# Patient Record
Sex: Female | Born: 1950 | Race: Black or African American | Hispanic: No | Marital: Married | Smoking: Never smoker
Health system: Southern US, Community
[De-identification: ages and names within clinical notes are randomized; demographics above are authoritative.]

## PROBLEM LIST (undated history)

## (undated) DIAGNOSIS — I10 Essential (primary) hypertension: Secondary | ICD-10-CM

## (undated) DIAGNOSIS — C801 Malignant (primary) neoplasm, unspecified: Secondary | ICD-10-CM

## (undated) HISTORY — PX: BREAST SURGERY: SHX581

## (undated) HISTORY — PX: OTHER SURGICAL HISTORY: SHX169

## (undated) HISTORY — PX: TUBAL LIGATION: SHX77

---

## 2005-01-11 ENCOUNTER — Ambulatory Visit (HOSPITAL_COMMUNITY): Admission: RE | Admit: 2005-01-11 | Discharge: 2005-01-11 | Payer: Self-pay | Admitting: Family Medicine

## 2005-01-19 ENCOUNTER — Encounter (INDEPENDENT_AMBULATORY_CARE_PROVIDER_SITE_OTHER): Payer: Self-pay | Admitting: *Deleted

## 2005-01-19 ENCOUNTER — Encounter: Admission: RE | Admit: 2005-01-19 | Discharge: 2005-01-19 | Payer: Self-pay | Admitting: Family Medicine

## 2005-01-19 ENCOUNTER — Encounter (INDEPENDENT_AMBULATORY_CARE_PROVIDER_SITE_OTHER): Payer: Self-pay | Admitting: Diagnostic Radiology

## 2005-01-25 ENCOUNTER — Ambulatory Visit: Payer: Self-pay | Admitting: Oncology

## 2005-01-27 ENCOUNTER — Encounter (HOSPITAL_COMMUNITY): Admission: RE | Admit: 2005-01-27 | Discharge: 2005-04-27 | Payer: Self-pay | Admitting: General Surgery

## 2005-02-02 ENCOUNTER — Encounter: Admission: RE | Admit: 2005-02-02 | Discharge: 2005-02-02 | Payer: Self-pay | Admitting: Oncology

## 2005-02-03 ENCOUNTER — Ambulatory Visit (HOSPITAL_COMMUNITY): Admission: RE | Admit: 2005-02-03 | Discharge: 2005-02-03 | Payer: Self-pay | Admitting: General Surgery

## 2005-02-03 ENCOUNTER — Ambulatory Visit (HOSPITAL_BASED_OUTPATIENT_CLINIC_OR_DEPARTMENT_OTHER): Admission: RE | Admit: 2005-02-03 | Discharge: 2005-02-03 | Payer: Self-pay | Admitting: General Surgery

## 2005-02-09 ENCOUNTER — Ambulatory Visit: Payer: Self-pay

## 2005-03-16 ENCOUNTER — Ambulatory Visit: Payer: Self-pay | Admitting: Oncology

## 2005-05-03 ENCOUNTER — Ambulatory Visit: Payer: Self-pay | Admitting: Oncology

## 2005-05-10 ENCOUNTER — Encounter: Admission: RE | Admit: 2005-05-10 | Discharge: 2005-05-10 | Payer: Self-pay | Admitting: Oncology

## 2005-05-22 ENCOUNTER — Ambulatory Visit: Admission: RE | Admit: 2005-05-22 | Discharge: 2005-07-20 | Payer: Self-pay | Admitting: *Deleted

## 2005-06-20 ENCOUNTER — Ambulatory Visit: Payer: Self-pay | Admitting: Oncology

## 2005-07-24 ENCOUNTER — Ambulatory Visit (HOSPITAL_COMMUNITY): Admission: RE | Admit: 2005-07-24 | Discharge: 2005-07-24 | Payer: Self-pay | Admitting: Oncology

## 2005-08-09 ENCOUNTER — Ambulatory Visit (HOSPITAL_COMMUNITY): Admission: RE | Admit: 2005-08-09 | Discharge: 2005-08-09 | Payer: Self-pay | Admitting: Oncology

## 2005-08-14 ENCOUNTER — Ambulatory Visit: Payer: Self-pay | Admitting: Oncology

## 2005-08-18 ENCOUNTER — Ambulatory Visit: Admission: RE | Admit: 2005-08-18 | Discharge: 2005-09-11 | Payer: Self-pay | Admitting: *Deleted

## 2005-08-24 ENCOUNTER — Encounter (INDEPENDENT_AMBULATORY_CARE_PROVIDER_SITE_OTHER): Payer: Self-pay | Admitting: Specialist

## 2005-08-24 ENCOUNTER — Ambulatory Visit (HOSPITAL_COMMUNITY): Admission: RE | Admit: 2005-08-24 | Discharge: 2005-08-25 | Payer: Self-pay | Admitting: General Surgery

## 2005-09-11 ENCOUNTER — Inpatient Hospital Stay (HOSPITAL_COMMUNITY): Admission: RE | Admit: 2005-09-11 | Discharge: 2005-09-19 | Payer: Self-pay | Admitting: Surgery

## 2005-10-02 ENCOUNTER — Ambulatory Visit: Payer: Self-pay | Admitting: Oncology

## 2005-11-08 ENCOUNTER — Ambulatory Visit (HOSPITAL_COMMUNITY): Admission: RE | Admit: 2005-11-08 | Discharge: 2005-11-09 | Payer: Self-pay | Admitting: General Surgery

## 2005-11-23 IMAGING — CT CT HEAD WO/W CM
1 of 5 series · 12 of 30 positions shown, 15 images · IV contrast (GASTROGRAFIN & [ID] OMNI 300)
Comparison: none

CLINICAL DATA: History of breast carcinoma.  Staging for new diagnosis left breast carcinoma. 
 CT HEAD W/O AND W/CONTRAST: 
 Axial scans from the base to the vertex were performed before and after IV contrast media was given.  172cc of Omnipaque 300 were given as the contrast media.  The ventricular system is normal in size and configuration and the septum is in a normal midline position.  The 4th ventricle and basilar cisterns appear normal.  No acute intracranial abnormality is seen.  No mass effect is noted.  After contrast enhancement, no enhancing lesion is seen.  On bone window images no bony abnormality is noted.

[Series 3: chest/abd/pelvis · axial · 0.64mm/px · z∈[-640,-144]mm · 12 of 118 slices shown, 15 images]
[im 10/118  brain]
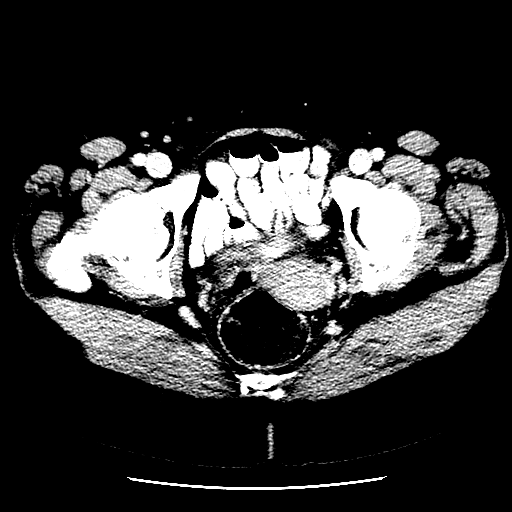
[im 10/118  bone]
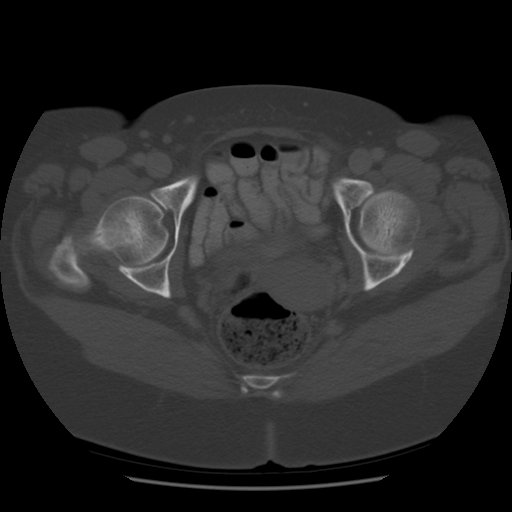
[im 19/118  brain]
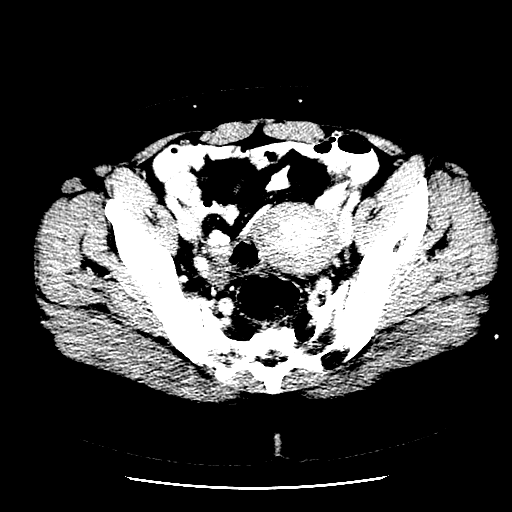
[im 28/118  brain]
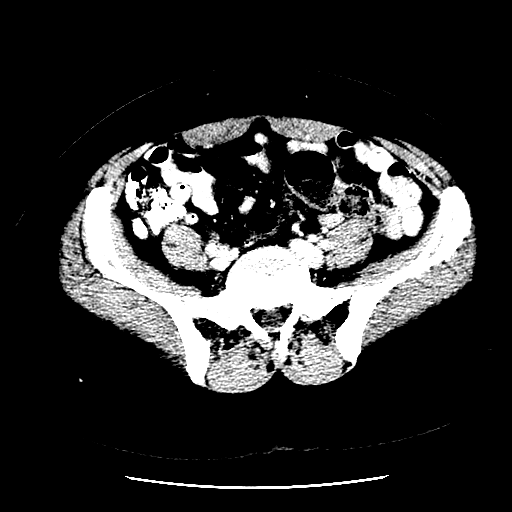
[im 37/118  brain]
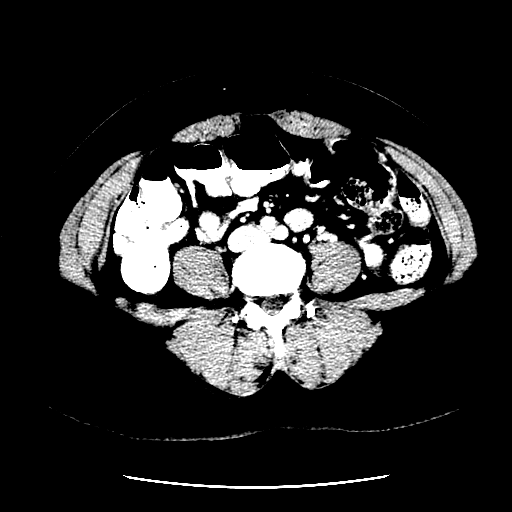
[im 46/118  brain]
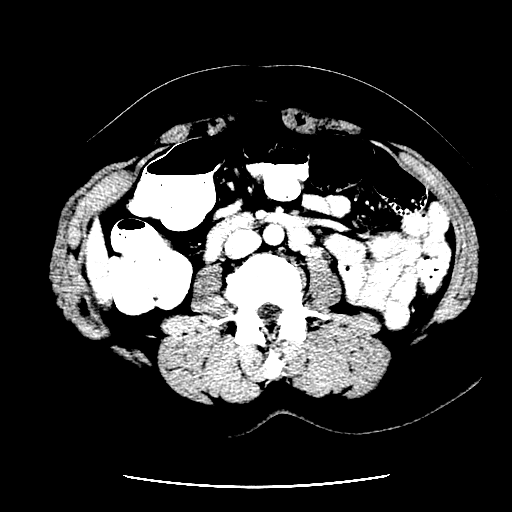
[im 46/118  bone]
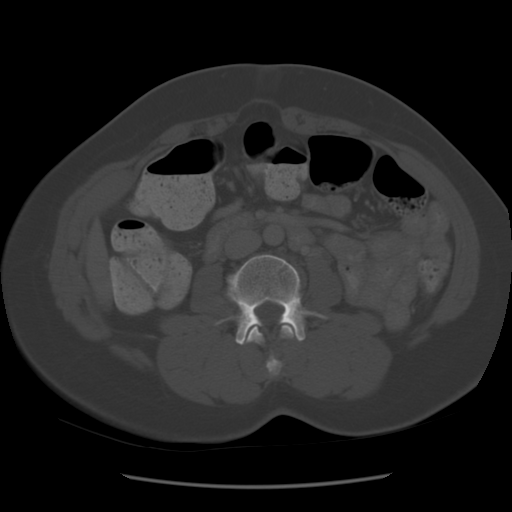
[im 55/118  brain]
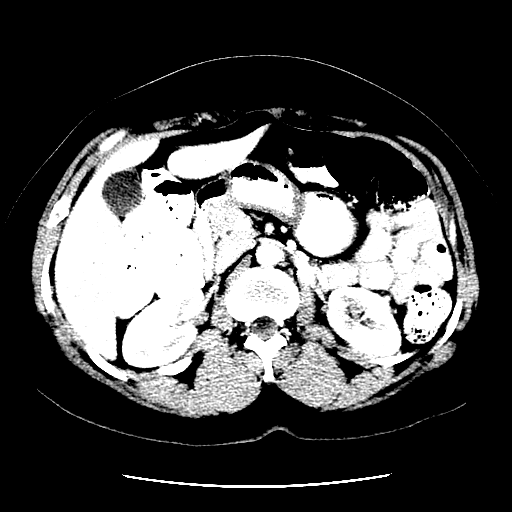
[im 64/118  brain]
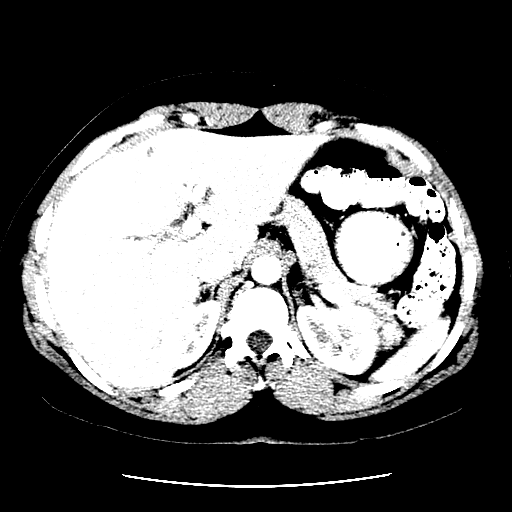
[im 73/118  brain]
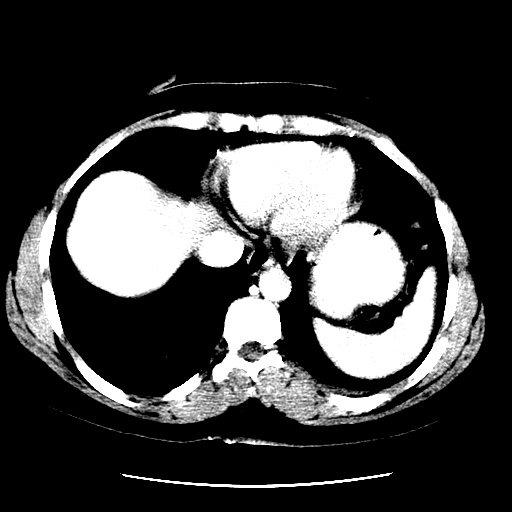
[im 82/118  brain]
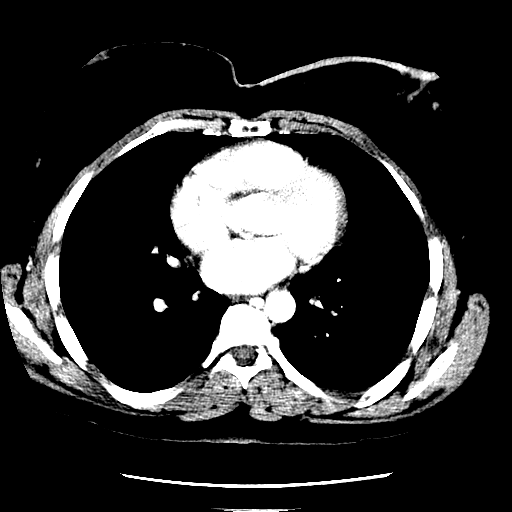
[im 82/118  bone]
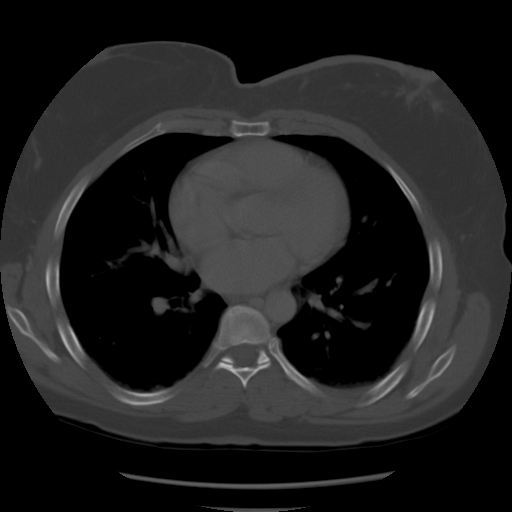
[im 91/118  brain]
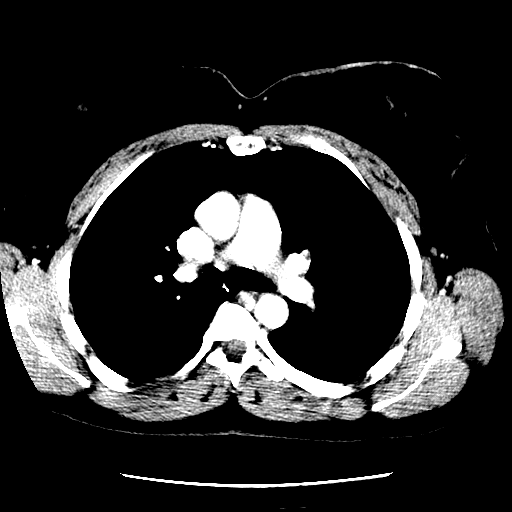
[im 100/118  brain]
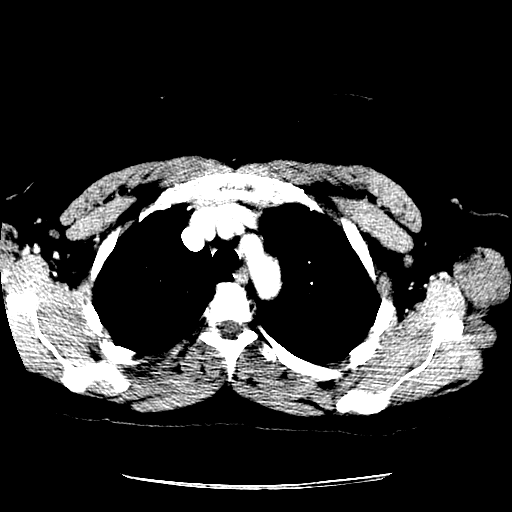
[im 109/118  brain]
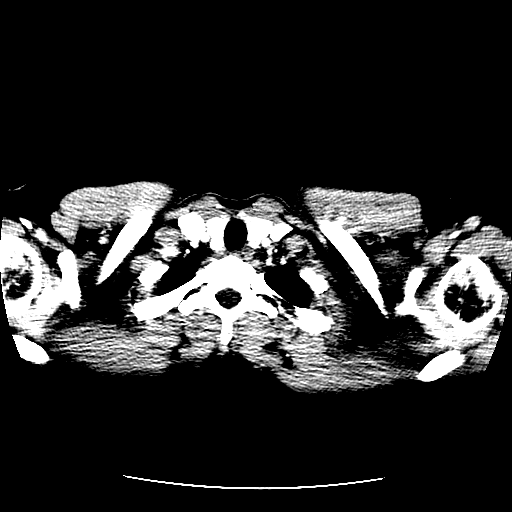

[12 of 30 positions shown; findings below may reference images not displayed]

IMPRESSION: Negative CT brain scan.  No metastatic involvement.  
 CT CHEST W/CONTRAST: 
 Multidetector helical scans through the chest were performed after IV contrast media was given.  No suspicious lung nodule is seen.  There is no evidence of mediastinal or hilar adenopathy.  The thoracic aorta and pulmonary arteries and grossly normal.  No axillary adenopathy is noted.  Asymmetrical soft tissue nodularity is noted in the left mid retroareolar breast in this patient with known left breast carcinoma.  There appears to be a very small left pleural effusion present.  The attenuation is lower than would be expected for pleural thickening with attenuation of 12 hounsfield units.
IMPRESSION: 1.  No lung metastasis.  No mediastinal or hilar adenopathy.  No pathologically enlarged axillary nodes are seen. 
 2.  Left breast nodular lesion with lesion in left retroareolar breast with retraction of the nipple.  
 3.  Question small left pleural effusion. 
 CT ABDOMEN W/CONTRAST: 
 Scans were continued through the abdomen after oral and IV contrast media were given.  There are multiple rounded low attenuation structures throughout the liver most consistent with benign process such as cyst.  These do not appear to represent metastatic lesion, but in view of the current clinical pattern ultrasound or MR Jaya be helpful to further evaluate these hepatic rounded low attenuation structures.  No calcified gallstones are seen.  The pancreas is normal in size as are the adrenal glands and the spleen.  The kidneys enhance normally and on delayed images pelvocaliceal systems appear normal.  The proximal ureters are normal in caliber.  The abdominal aorta appears normal.  No adenopathy is seen.  Herniation of fat is noted at the level of the umbilicus.
IMPRESSION: 1.  Multiple rounded low attenuation structures within the liver most consistent with benign process such as cyst.  However in view of the patient?s history further assessment with MR or ultrasound may be helpful. 
 2.  No evidence of adenopathy. 
 3.  Umbilical hernia containing fat. 
 CT PELVIS W/CONTRAST: 
 The uterus is normal in size.  The urinary bladder is decompressed and unremarkable.  No free fluid is seen within the pelvis.  No adenopathy is noted.  In reviewing bone window images the faint activity on bone scan in the lower lumbar spine on today?s bone scan is most likely due to degenerative change involving the facet joints of the lower lumbar spine.  No lytic or blastic lesion is evident.
IMPRESSION: 1.  Negative CT of the pelvis for metastatic involvement. 
 2.  Faint activity in the lower lumbar spine on today?s bone scan probably is due to degenerative change involving the facet joints.

## 2005-12-02 ENCOUNTER — Emergency Department (HOSPITAL_COMMUNITY): Admission: EM | Admit: 2005-12-02 | Discharge: 2005-12-02 | Payer: Self-pay | Admitting: Emergency Medicine

## 2005-12-06 ENCOUNTER — Ambulatory Visit: Payer: Self-pay | Admitting: Oncology

## 2005-12-07 ENCOUNTER — Ambulatory Visit (HOSPITAL_COMMUNITY): Admission: RE | Admit: 2005-12-07 | Discharge: 2005-12-07 | Payer: Self-pay | Admitting: Oncology

## 2006-01-26 ENCOUNTER — Ambulatory Visit: Payer: Self-pay | Admitting: Oncology

## 2006-01-31 ENCOUNTER — Ambulatory Visit (HOSPITAL_COMMUNITY): Admission: RE | Admit: 2006-01-31 | Discharge: 2006-01-31 | Payer: Self-pay | Admitting: Oncology

## 2006-02-22 ENCOUNTER — Encounter: Admission: RE | Admit: 2006-02-22 | Discharge: 2006-02-22 | Payer: Self-pay | Admitting: Oncology

## 2006-03-15 ENCOUNTER — Ambulatory Visit: Payer: Self-pay | Admitting: Oncology

## 2006-03-19 ENCOUNTER — Encounter: Admission: RE | Admit: 2006-03-19 | Discharge: 2006-03-19 | Payer: Self-pay | Admitting: Oncology

## 2006-04-25 ENCOUNTER — Encounter: Admission: RE | Admit: 2006-04-25 | Discharge: 2006-04-25 | Payer: Self-pay | Admitting: Family Medicine

## 2006-04-25 ENCOUNTER — Encounter (INDEPENDENT_AMBULATORY_CARE_PROVIDER_SITE_OTHER): Payer: Self-pay | Admitting: Specialist

## 2006-04-25 ENCOUNTER — Ambulatory Visit (HOSPITAL_COMMUNITY): Admission: RE | Admit: 2006-04-25 | Discharge: 2006-04-25 | Payer: Self-pay | Admitting: Oncology

## 2006-04-25 ENCOUNTER — Encounter (INDEPENDENT_AMBULATORY_CARE_PROVIDER_SITE_OTHER): Payer: Self-pay | Admitting: Diagnostic Radiology

## 2006-04-30 ENCOUNTER — Ambulatory Visit: Payer: Self-pay | Admitting: Oncology

## 2006-04-30 LAB — CBC WITH DIFFERENTIAL/PLATELET
Eosinophils Absolute: 0.3 10*3/uL (ref 0.0–0.5)
HCT: 35.8 % (ref 34.8–46.6)
LYMPH%: 36.7 % (ref 14.0–48.0)
MCHC: 34.4 g/dL (ref 32.0–36.0)
MONO#: 0.4 10*3/uL (ref 0.1–0.9)
NEUT%: 45.2 % (ref 39.6–76.8)
Platelets: 213 10*3/uL (ref 145–400)
WBC: 3.5 10*3/uL — ABNORMAL LOW (ref 3.9–10.0)

## 2006-05-21 ENCOUNTER — Ambulatory Visit (HOSPITAL_COMMUNITY): Admission: RE | Admit: 2006-05-21 | Discharge: 2006-05-22 | Payer: Self-pay | Admitting: General Surgery

## 2006-05-21 ENCOUNTER — Encounter (INDEPENDENT_AMBULATORY_CARE_PROVIDER_SITE_OTHER): Payer: Self-pay | Admitting: Specialist

## 2006-05-22 ENCOUNTER — Emergency Department (HOSPITAL_COMMUNITY): Admission: EM | Admit: 2006-05-22 | Discharge: 2006-05-22 | Payer: Self-pay | Admitting: Emergency Medicine

## 2006-06-13 ENCOUNTER — Ambulatory Visit: Payer: Self-pay | Admitting: Oncology

## 2006-06-13 LAB — CBC WITH DIFFERENTIAL/PLATELET
BASO%: 0.4 % (ref 0.0–2.0)
Eosinophils Absolute: 0.2 10*3/uL (ref 0.0–0.5)
LYMPH%: 39.1 % (ref 14.0–48.0)
MCHC: 33.8 g/dL (ref 32.0–36.0)
MONO#: 0.4 10*3/uL (ref 0.1–0.9)
NEUT#: 1.6 10*3/uL (ref 1.5–6.5)
Platelets: 247 10*3/uL (ref 145–400)
RBC: 4.01 10*6/uL (ref 3.70–5.32)
RDW: 14.4 % (ref 11.3–14.5)
WBC: 3.6 10*3/uL — ABNORMAL LOW (ref 3.9–10.0)

## 2006-06-13 LAB — COMPREHENSIVE METABOLIC PANEL
ALT: <8 U/L (ref 0–40)
AST: 11 U/L (ref 0–37)
CO2: 31 mEq/L (ref 19–32)
Calcium: 8.9 mg/dL (ref 8.4–10.5)
Chloride: 103 mEq/L (ref 96–112)
Creatinine, Ser: 0.85 mg/dL (ref 0.40–1.20)
Sodium: 142 mEq/L (ref 135–145)
Total Bilirubin: 0.4 mg/dL (ref 0.3–1.2)
Total Protein: 6.9 g/dL (ref 6.0–8.3)

## 2006-06-13 LAB — CANCER ANTIGEN 27.29: CA 27.29: 15 U/mL (ref 0–39)

## 2006-06-14 ENCOUNTER — Ambulatory Visit: Admission: RE | Admit: 2006-06-14 | Discharge: 2006-09-12 | Payer: Self-pay | Admitting: *Deleted

## 2006-06-26 ENCOUNTER — Ambulatory Visit: Payer: Self-pay

## 2006-06-26 ENCOUNTER — Encounter: Payer: Self-pay | Admitting: Internal Medicine

## 2006-06-27 ENCOUNTER — Ambulatory Visit (HOSPITAL_COMMUNITY): Admission: RE | Admit: 2006-06-27 | Discharge: 2006-06-27 | Payer: Self-pay | Admitting: Oncology

## 2006-07-27 LAB — CBC WITH DIFFERENTIAL/PLATELET
Basophils Absolute: 0 10*3/uL (ref 0.0–0.1)
Eosinophils Absolute: 0.2 10*3/uL (ref 0.0–0.5)
HCT: 35.7 % (ref 34.8–46.6)
HGB: 11.6 g/dL (ref 11.6–15.9)
LYMPH%: 29.7 % (ref 14.0–48.0)
MONO#: 0.3 10*3/uL (ref 0.1–0.9)
NEUT#: 0.9 10*3/uL — ABNORMAL LOW (ref 1.5–6.5)
Platelets: 165 10*3/uL (ref 145–400)
RBC: 4.04 10*6/uL (ref 3.70–5.32)
WBC: 1.9 10*3/uL — ABNORMAL LOW (ref 3.9–10.0)

## 2006-07-27 LAB — COMPREHENSIVE METABOLIC PANEL
Albumin: 3.5 g/dL (ref 3.5–5.2)
Alkaline Phosphatase: 52 U/L (ref 39–117)
BUN: 14 mg/dL (ref 6–23)
Creatinine, Ser: 0.67 mg/dL (ref 0.40–1.20)
Glucose, Bld: 84 mg/dL (ref 70–99)
Total Bilirubin: 0.3 mg/dL (ref 0.3–1.2)

## 2006-08-10 ENCOUNTER — Ambulatory Visit (HOSPITAL_COMMUNITY): Admission: RE | Admit: 2006-08-10 | Discharge: 2006-08-10 | Payer: Self-pay | Admitting: Oncology

## 2006-08-15 ENCOUNTER — Ambulatory Visit: Payer: Self-pay | Admitting: Oncology

## 2006-08-17 LAB — CBC WITH DIFFERENTIAL/PLATELET
Basophils Absolute: 0 10*3/uL (ref 0.0–0.1)
EOS%: 10.6 % — ABNORMAL HIGH (ref 0.0–7.0)
Eosinophils Absolute: 0.2 10*3/uL (ref 0.0–0.5)
HCT: 35.8 % (ref 34.8–46.6)
HGB: 12.3 g/dL (ref 11.6–15.9)
MCH: 29.5 pg (ref 26.0–34.0)
NEUT#: 1.2 10*3/uL — ABNORMAL LOW (ref 1.5–6.5)
NEUT%: 56.2 % (ref 39.6–76.8)
lymph#: 0.3 10*3/uL — ABNORMAL LOW (ref 0.9–3.3)

## 2006-09-07 LAB — CBC WITH DIFFERENTIAL/PLATELET
BASO%: 1.8 % (ref 0.0–2.0)
EOS%: 3.1 % (ref 0.0–7.0)
LYMPH%: 18.5 % (ref 14.0–48.0)
MCHC: 34.2 g/dL (ref 32.0–36.0)
MCV: 85.3 fL (ref 81.0–101.0)
MONO#: 0.5 10*3/uL (ref 0.1–0.9)
MONO%: 14.8 % — ABNORMAL HIGH (ref 0.0–13.0)
Platelets: 167 10*3/uL (ref 145–400)
RBC: 4.2 10*6/uL (ref 3.70–5.32)
WBC: 3.1 10*3/uL — ABNORMAL LOW (ref 3.9–10.0)

## 2006-09-14 ENCOUNTER — Ambulatory Visit (HOSPITAL_COMMUNITY): Admission: RE | Admit: 2006-09-14 | Discharge: 2006-09-14 | Payer: Self-pay | Admitting: Oncology

## 2006-09-17 LAB — CBC WITH DIFFERENTIAL/PLATELET
BASO%: 0.6 % (ref 0.0–2.0)
MCHC: 33.8 g/dL (ref 32.0–36.0)
MONO#: 0.4 10*3/uL (ref 0.1–0.9)
RBC: 4.15 10*6/uL (ref 3.70–5.32)
WBC: 3 10*3/uL — ABNORMAL LOW (ref 3.9–10.0)
lymph#: 0.8 10*3/uL — ABNORMAL LOW (ref 0.9–3.3)

## 2006-09-28 LAB — CBC WITH DIFFERENTIAL/PLATELET
BASO%: 1.7 % (ref 0.0–2.0)
Basophils Absolute: 0 10*3/uL (ref 0.0–0.1)
HCT: 35.2 % (ref 34.8–46.6)
HGB: 11.9 g/dL (ref 11.6–15.9)
MCHC: 33.7 g/dL (ref 32.0–36.0)
MONO#: 0.5 10*3/uL (ref 0.1–0.9)
NEUT%: 42.6 % (ref 39.6–76.8)
RDW: 13.9 % (ref 11.3–14.5)
WBC: 2.2 10*3/uL — ABNORMAL LOW (ref 3.9–10.0)
lymph#: 0.6 10*3/uL — ABNORMAL LOW (ref 0.9–3.3)

## 2006-10-17 ENCOUNTER — Ambulatory Visit: Payer: Self-pay | Admitting: Oncology

## 2006-10-19 LAB — COMPREHENSIVE METABOLIC PANEL
ALT: 15 U/L (ref 0–35)
Albumin: 3.7 g/dL (ref 3.5–5.2)
BUN: 9 mg/dL (ref 6–23)
CO2: 25 mEq/L (ref 19–32)
Calcium: 8.5 mg/dL (ref 8.4–10.5)
Chloride: 105 mEq/L (ref 96–112)
Creatinine, Ser: 0.63 mg/dL (ref 0.40–1.20)

## 2006-10-19 LAB — CBC WITH DIFFERENTIAL/PLATELET
Basophils Absolute: 0.1 10*3/uL (ref 0.0–0.1)
HCT: 32.9 % — ABNORMAL LOW (ref 34.8–46.6)
HGB: 10.8 g/dL — ABNORMAL LOW (ref 11.6–15.9)
MONO#: 0.5 10*3/uL (ref 0.1–0.9)
NEUT#: 1.5 10*3/uL (ref 1.5–6.5)
NEUT%: 53.4 % (ref 39.6–76.8)
WBC: 2.8 10*3/uL — ABNORMAL LOW (ref 3.9–10.0)
lymph#: 0.7 10*3/uL — ABNORMAL LOW (ref 0.9–3.3)

## 2006-11-09 LAB — CBC WITH DIFFERENTIAL/PLATELET
BASO%: 0.6 % (ref 0.0–2.0)
EOS%: 5.8 % (ref 0.0–7.0)
LYMPH%: 33 % (ref 14.0–48.0)
MCH: 28.7 pg (ref 26.0–34.0)
MCHC: 33.4 g/dL (ref 32.0–36.0)
MONO#: 0.3 10*3/uL (ref 0.1–0.9)
MONO%: 10.9 % (ref 0.0–13.0)
NEUT%: 49.6 % (ref 39.6–76.8)
Platelets: 199 10*3/uL (ref 145–400)
RBC: 4.2 10*6/uL (ref 3.70–5.32)
WBC: 3 10*3/uL — ABNORMAL LOW (ref 3.9–10.0)

## 2006-11-22 ENCOUNTER — Ambulatory Visit: Admission: RE | Admit: 2006-11-22 | Discharge: 2006-11-22 | Payer: Self-pay | Admitting: Oncology

## 2006-11-22 ENCOUNTER — Encounter (INDEPENDENT_AMBULATORY_CARE_PROVIDER_SITE_OTHER): Payer: Self-pay | Admitting: *Deleted

## 2006-11-22 ENCOUNTER — Ambulatory Visit (HOSPITAL_COMMUNITY): Admission: RE | Admit: 2006-11-22 | Discharge: 2006-11-22 | Payer: Self-pay | Admitting: Oncology

## 2006-11-27 ENCOUNTER — Ambulatory Visit: Payer: Self-pay | Admitting: Oncology

## 2006-11-29 LAB — CBC WITH DIFFERENTIAL/PLATELET
BASO%: 0.7 % (ref 0.0–2.0)
EOS%: 4.4 % (ref 0.0–7.0)
HCT: 36.6 % (ref 34.8–46.6)
MCH: 28.8 pg (ref 26.0–34.0)
MCHC: 33 g/dL (ref 32.0–36.0)
MONO#: 0.4 10*3/uL (ref 0.1–0.9)
RBC: 4.2 10*6/uL (ref 3.70–5.32)
RDW: 14 % (ref 11.3–14.5)
WBC: 3.6 10*3/uL — ABNORMAL LOW (ref 3.9–10.0)
lymph#: 1.1 10*3/uL (ref 0.9–3.3)

## 2006-11-29 LAB — COMPREHENSIVE METABOLIC PANEL
ALT: 11 U/L (ref 0–35)
AST: 16 U/L (ref 0–37)
CO2: 24 mEq/L (ref 19–32)
Calcium: 9.2 mg/dL (ref 8.4–10.5)
Chloride: 103 mEq/L (ref 96–112)
Creatinine, Ser: 0.75 mg/dL (ref 0.40–1.20)
Potassium: 3.9 mEq/L (ref 3.5–5.3)
Sodium: 140 mEq/L (ref 135–145)
Total Protein: 7.1 g/dL (ref 6.0–8.3)

## 2006-11-29 LAB — CANCER ANTIGEN 27.29: CA 27.29: 13 U/mL (ref 0–39)

## 2007-01-09 ENCOUNTER — Ambulatory Visit: Payer: Self-pay | Admitting: Oncology

## 2007-01-11 LAB — CBC WITH DIFFERENTIAL/PLATELET
Basophils Absolute: 0 10*3/uL (ref 0.0–0.1)
Eosinophils Absolute: 0.1 10*3/uL (ref 0.0–0.5)
HGB: 12.6 g/dL (ref 11.6–15.9)
MCV: 87.1 fL (ref 81.0–101.0)
MONO%: 13.1 % — ABNORMAL HIGH (ref 0.0–13.0)
NEUT#: 1.4 10*3/uL — ABNORMAL LOW (ref 1.5–6.5)
Platelets: 190 10*3/uL (ref 145–400)
RDW: 14.5 % (ref 11.3–14.5)

## 2007-02-01 LAB — CBC WITH DIFFERENTIAL/PLATELET
Basophils Absolute: 0 10*3/uL (ref 0.0–0.1)
Eosinophils Absolute: 0.3 10*3/uL (ref 0.0–0.5)
HCT: 38.2 % (ref 34.8–46.6)
LYMPH%: 33.5 % (ref 14.0–48.0)
MCV: 85.5 fL (ref 81.0–101.0)
MONO%: 11.3 % (ref 0.0–13.0)
NEUT#: 1.8 10*3/uL (ref 1.5–6.5)
NEUT%: 45.5 % (ref 39.6–76.8)
Platelets: 205 10*3/uL (ref 145–400)
RBC: 4.47 10*6/uL (ref 3.70–5.32)

## 2007-02-19 ENCOUNTER — Ambulatory Visit: Payer: Self-pay | Admitting: Oncology

## 2007-02-21 ENCOUNTER — Ambulatory Visit: Admission: RE | Admit: 2007-02-21 | Discharge: 2007-02-21 | Payer: Self-pay | Admitting: Oncology

## 2007-02-21 ENCOUNTER — Encounter: Payer: Self-pay | Admitting: Cardiology

## 2007-02-21 LAB — CBC WITH DIFFERENTIAL/PLATELET
BASO%: 1.1 % (ref 0.0–2.0)
EOS%: 6.3 % (ref 0.0–7.0)
LYMPH%: 36.6 % (ref 14.0–48.0)
MCH: 29.8 pg (ref 26.0–34.0)
MCHC: 34.6 g/dL (ref 32.0–36.0)
MCV: 86.2 fL (ref 81.0–101.0)
MONO%: 11 % (ref 0.0–13.0)
NEUT%: 45.1 % (ref 39.6–76.8)
Platelets: 191 10*3/uL (ref 145–400)
RBC: 4.52 10*6/uL (ref 3.70–5.32)
WBC: 3.5 10*3/uL — ABNORMAL LOW (ref 3.9–10.0)

## 2007-03-14 LAB — CBC WITH DIFFERENTIAL/PLATELET
BASO%: 0.9 % (ref 0.0–2.0)
Basophils Absolute: 0 10*3/uL (ref 0.0–0.1)
EOS%: 4.7 % (ref 0.0–7.0)
Eosinophils Absolute: 0.2 10*3/uL (ref 0.0–0.5)
HCT: 35.2 % (ref 34.8–46.6)
HGB: 12 g/dL (ref 11.6–15.9)
LYMPH%: 33.7 % (ref 14.0–48.0)
MCH: 29.9 pg (ref 26.0–34.0)
MCHC: 34.1 g/dL (ref 32.0–36.0)
MCV: 87.6 fL (ref 81.0–101.0)
MONO#: 0.3 10*3/uL (ref 0.1–0.9)
MONO%: 9.2 % (ref 0.0–13.0)
NEUT#: 1.8 10*3/uL (ref 1.5–6.5)
NEUT%: 51.5 % (ref 39.6–76.8)
Platelets: 188 10*3/uL (ref 145–400)
RBC: 4.02 10*6/uL (ref 3.70–5.32)
RDW: 14.3 % (ref 11.3–14.5)
WBC: 3.4 10*3/uL — ABNORMAL LOW (ref 3.9–10.0)
lymph#: 1.2 10*3/uL (ref 0.9–3.3)

## 2007-06-11 ENCOUNTER — Ambulatory Visit: Payer: Self-pay | Admitting: Oncology

## 2007-06-13 ENCOUNTER — Ambulatory Visit (HOSPITAL_COMMUNITY): Admission: RE | Admit: 2007-06-13 | Discharge: 2007-06-13 | Payer: Self-pay | Admitting: Oncology

## 2007-06-13 ENCOUNTER — Ambulatory Visit: Admission: RE | Admit: 2007-06-13 | Discharge: 2007-06-13 | Payer: Self-pay | Admitting: Oncology

## 2007-06-13 ENCOUNTER — Encounter: Payer: Self-pay | Admitting: Oncology

## 2007-06-13 LAB — CBC WITH DIFFERENTIAL/PLATELET
BASO%: 0.3 % (ref 0.0–2.0)
Basophils Absolute: 0 10*3/uL (ref 0.0–0.1)
EOS%: 3.3 % (ref 0.0–7.0)
HCT: 40.7 % (ref 34.8–46.6)
HGB: 14.1 g/dL (ref 11.6–15.9)
MCH: 30.9 pg (ref 26.0–34.0)
MCHC: 34.7 g/dL (ref 32.0–36.0)
MONO#: 0.3 10*3/uL (ref 0.1–0.9)
NEUT%: 67.1 % (ref 39.6–76.8)
RDW: 13.9 % (ref 11.3–14.5)
WBC: 4.5 10*3/uL (ref 3.9–10.0)
lymph#: 1 10*3/uL (ref 0.9–3.3)

## 2007-06-13 LAB — COMPREHENSIVE METABOLIC PANEL
ALT: 13 U/L (ref 0–35)
Alkaline Phosphatase: 102 U/L (ref 39–117)
CO2: 27 mEq/L (ref 19–32)
Creatinine, Ser: 0.95 mg/dL (ref 0.40–1.20)
Total Bilirubin: 0.5 mg/dL (ref 0.3–1.2)

## 2007-06-13 LAB — CANCER ANTIGEN 27.29: CA 27.29: 10 U/mL (ref 0–39)

## 2007-09-18 ENCOUNTER — Ambulatory Visit: Payer: Self-pay | Admitting: Oncology

## 2007-09-20 LAB — CBC WITH DIFFERENTIAL/PLATELET
Basophils Absolute: 0 10*3/uL (ref 0.0–0.1)
EOS%: 4.6 % (ref 0.0–7.0)
HCT: 39 % (ref 34.8–46.6)
HGB: 13.8 g/dL (ref 11.6–15.9)
LYMPH%: 32.2 % (ref 14.0–48.0)
MCH: 31.3 pg (ref 26.0–34.0)
MONO#: 0.3 10*3/uL (ref 0.1–0.9)
NEUT%: 55.9 % (ref 39.6–76.8)
Platelets: 226 10*3/uL (ref 145–400)
lymph#: 1.2 10*3/uL (ref 0.9–3.3)

## 2007-09-20 LAB — COMPREHENSIVE METABOLIC PANEL
BUN: 14 mg/dL (ref 6–23)
CO2: 28 mEq/L (ref 19–32)
Calcium: 9.2 mg/dL (ref 8.4–10.5)
Chloride: 104 mEq/L (ref 96–112)
Creatinine, Ser: 0.85 mg/dL (ref 0.40–1.20)

## 2007-09-20 LAB — CANCER ANTIGEN 27.29: CA 27.29: 11 U/mL (ref 0–39)

## 2007-10-07 ENCOUNTER — Emergency Department (HOSPITAL_COMMUNITY): Admission: EM | Admit: 2007-10-07 | Discharge: 2007-10-07 | Payer: Self-pay | Admitting: Emergency Medicine

## 2007-11-05 ENCOUNTER — Ambulatory Visit: Payer: Self-pay | Admitting: Oncology

## 2007-12-17 ENCOUNTER — Ambulatory Visit: Payer: Self-pay | Admitting: Oncology

## 2007-12-19 LAB — COMPREHENSIVE METABOLIC PANEL
CO2: 28 mEq/L (ref 19–32)
Creatinine, Ser: 1.09 mg/dL (ref 0.40–1.20)
Glucose, Bld: 63 mg/dL — ABNORMAL LOW (ref 70–99)
Total Bilirubin: 0.5 mg/dL (ref 0.3–1.2)

## 2007-12-19 LAB — CBC WITH DIFFERENTIAL/PLATELET
BASO%: 1.2 % (ref 0.0–2.0)
Eosinophils Absolute: 0.2 10*3/uL (ref 0.0–0.5)
HCT: 39.3 % (ref 34.8–46.6)
LYMPH%: 37.9 % (ref 14.0–48.0)
MCHC: 33.5 g/dL (ref 32.0–36.0)
MONO#: 0.3 10*3/uL (ref 0.1–0.9)
NEUT%: 42.7 % (ref 39.6–76.8)
Platelets: 176 10*3/uL (ref 145–400)
WBC: 3 10*3/uL — ABNORMAL LOW (ref 3.9–10.0)

## 2007-12-19 LAB — CANCER ANTIGEN 27.29: CA 27.29: 12 U/mL (ref 0–39)

## 2008-03-12 ENCOUNTER — Ambulatory Visit: Payer: Self-pay | Admitting: Oncology

## 2008-03-16 ENCOUNTER — Ambulatory Visit (HOSPITAL_COMMUNITY): Admission: RE | Admit: 2008-03-16 | Discharge: 2008-03-16 | Payer: Self-pay | Admitting: Oncology

## 2008-03-16 LAB — CBC WITH DIFFERENTIAL/PLATELET
Basophils Absolute: 0 10*3/uL (ref 0.0–0.1)
Eosinophils Absolute: 0.2 10*3/uL (ref 0.0–0.5)
HCT: 42 % (ref 34.8–46.6)
HGB: 14 g/dL (ref 11.6–15.9)
LYMPH%: 45.6 % (ref 14.0–48.0)
MCV: 91.1 fL (ref 81.0–101.0)
MONO%: 9.4 % (ref 0.0–13.0)
NEUT#: 1.3 10*3/uL — ABNORMAL LOW (ref 1.5–6.5)
NEUT%: 38.9 % — ABNORMAL LOW (ref 39.6–76.8)
Platelets: 192 10*3/uL (ref 145–400)

## 2008-03-16 LAB — FOLLICLE STIMULATING HORMONE: FSH: 47.8 m[IU]/mL

## 2008-03-16 LAB — COMPREHENSIVE METABOLIC PANEL
Alkaline Phosphatase: 92 U/L (ref 39–117)
BUN: 9 mg/dL (ref 6–23)
Glucose, Bld: 99 mg/dL (ref 70–99)
Total Bilirubin: 1.1 mg/dL (ref 0.3–1.2)

## 2008-03-16 LAB — CANCER ANTIGEN 27.29: CA 27.29: 15 U/mL (ref 0–39)

## 2008-04-27 ENCOUNTER — Ambulatory Visit: Payer: Self-pay | Admitting: Oncology

## 2008-06-12 ENCOUNTER — Ambulatory Visit: Payer: Self-pay | Admitting: Oncology

## 2008-09-03 ENCOUNTER — Ambulatory Visit: Payer: Self-pay | Admitting: Oncology

## 2008-09-07 LAB — COMPREHENSIVE METABOLIC PANEL
AST: 19 U/L (ref 0–37)
Alkaline Phosphatase: 88 U/L (ref 39–117)
Glucose, Bld: 103 mg/dL — ABNORMAL HIGH (ref 70–99)
Sodium: 141 mEq/L (ref 135–145)
Total Bilirubin: 0.9 mg/dL (ref 0.3–1.2)
Total Protein: 7.5 g/dL (ref 6.0–8.3)

## 2008-09-07 LAB — CBC WITH DIFFERENTIAL/PLATELET
BASO%: 0.3 % (ref 0.0–2.0)
EOS%: 3.6 % (ref 0.0–7.0)
Eosinophils Absolute: 0.1 10*3/uL (ref 0.0–0.5)
LYMPH%: 38.2 % (ref 14.0–48.0)
MCH: 31 pg (ref 26.0–34.0)
MCHC: 34.4 g/dL (ref 32.0–36.0)
MCV: 90.1 fL (ref 81.0–101.0)
MONO%: 8.2 % (ref 0.0–13.0)
Platelets: 221 10*3/uL (ref 145–400)
RBC: 4.61 10*6/uL (ref 3.70–5.32)
RDW: 13.8 % (ref 11.3–14.5)

## 2008-09-18 ENCOUNTER — Encounter: Admission: RE | Admit: 2008-09-18 | Discharge: 2008-09-18 | Payer: Self-pay | Admitting: Oncology

## 2008-10-27 ENCOUNTER — Ambulatory Visit: Payer: Self-pay | Admitting: Oncology

## 2008-12-15 ENCOUNTER — Ambulatory Visit: Payer: Self-pay | Admitting: Oncology

## 2009-02-01 ENCOUNTER — Ambulatory Visit: Payer: Self-pay | Admitting: Oncology

## 2009-02-16 LAB — COMPREHENSIVE METABOLIC PANEL
ALT: 15 U/L (ref 0–35)
AST: 23 U/L (ref 0–37)
Albumin: 3.5 g/dL (ref 3.5–5.2)
Alkaline Phosphatase: 86 U/L (ref 39–117)
Calcium: 9.2 mg/dL (ref 8.4–10.5)
Chloride: 103 mEq/L (ref 96–112)
Potassium: 3.9 mEq/L (ref 3.5–5.3)
Sodium: 140 mEq/L (ref 135–145)
Total Protein: 7 g/dL (ref 6.0–8.3)

## 2009-02-16 LAB — CBC WITH DIFFERENTIAL/PLATELET
Basophils Absolute: 0 10*3/uL (ref 0.0–0.1)
EOS%: 3.6 % (ref 0.0–7.0)
HGB: 13.7 g/dL (ref 11.6–15.9)
MCH: 31 pg (ref 25.1–34.0)
MCV: 90.1 fL (ref 79.5–101.0)
MONO%: 9.7 % (ref 0.0–14.0)
NEUT#: 1.3 10*3/uL — ABNORMAL LOW (ref 1.5–6.5)
RBC: 4.42 10*6/uL (ref 3.70–5.45)
RDW: 13.5 % (ref 11.2–14.5)
lymph#: 1.4 10*3/uL (ref 0.9–3.3)

## 2009-06-14 ENCOUNTER — Ambulatory Visit (HOSPITAL_COMMUNITY): Admission: RE | Admit: 2009-06-14 | Discharge: 2009-06-14 | Payer: Self-pay | Admitting: Oncology

## 2009-06-15 ENCOUNTER — Ambulatory Visit: Payer: Self-pay | Admitting: Oncology

## 2009-06-17 LAB — CBC WITH DIFFERENTIAL/PLATELET
EOS%: 2.8 % (ref 0.0–7.0)
Eosinophils Absolute: 0.1 10*3/uL (ref 0.0–0.5)
HGB: 13.6 g/dL (ref 11.6–15.9)
MCV: 88.8 fL (ref 79.5–101.0)
MONO%: 9.7 % (ref 0.0–14.0)
NEUT#: 1.3 10*3/uL — ABNORMAL LOW (ref 1.5–6.5)
RBC: 4.46 10*6/uL (ref 3.70–5.45)
RDW: 13.6 % (ref 11.2–14.5)
lymph#: 1.5 10*3/uL (ref 0.9–3.3)

## 2009-06-18 LAB — COMPREHENSIVE METABOLIC PANEL
ALT: 13 U/L (ref 0–35)
BUN: 15 mg/dL (ref 6–23)
CO2: 24 mEq/L (ref 19–32)
Calcium: 8.8 mg/dL (ref 8.4–10.5)
Chloride: 107 mEq/L (ref 96–112)
Creatinine, Ser: 0.97 mg/dL (ref 0.40–1.20)
Glucose, Bld: 81 mg/dL (ref 70–99)
Total Bilirubin: 0.6 mg/dL (ref 0.3–1.2)

## 2009-06-18 LAB — VITAMIN D 25 HYDROXY (VIT D DEFICIENCY, FRACTURES): Vit D, 25-Hydroxy: 23 ng/mL — ABNORMAL LOW (ref 30–89)

## 2009-06-18 LAB — CANCER ANTIGEN 27.29: CA 27.29: 7 U/mL (ref 0–39)

## 2009-06-29 LAB — ESTRADIOL, ULTRA SENS

## 2009-07-27 ENCOUNTER — Ambulatory Visit: Payer: Self-pay | Admitting: Oncology

## 2009-09-07 ENCOUNTER — Ambulatory Visit: Payer: Self-pay | Admitting: Oncology

## 2009-10-21 ENCOUNTER — Ambulatory Visit: Payer: Self-pay | Admitting: Oncology

## 2009-11-30 ENCOUNTER — Ambulatory Visit: Payer: Self-pay | Admitting: Oncology

## 2009-12-16 ENCOUNTER — Ambulatory Visit (HOSPITAL_COMMUNITY): Admission: RE | Admit: 2009-12-16 | Discharge: 2009-12-16 | Payer: Self-pay | Admitting: Oncology

## 2009-12-16 LAB — COMPREHENSIVE METABOLIC PANEL
ALT: 14 U/L (ref 0–35)
AST: 17 U/L (ref 0–37)
Albumin: 3.8 g/dL (ref 3.5–5.2)
Calcium: 9.2 mg/dL (ref 8.4–10.5)
Chloride: 102 mEq/L (ref 96–112)
Potassium: 3.5 mEq/L (ref 3.5–5.3)
Sodium: 139 mEq/L (ref 135–145)
Total Bilirubin: 0.9 mg/dL (ref 0.3–1.2)
Total Protein: 7.3 g/dL (ref 6.0–8.3)

## 2009-12-16 LAB — CBC WITH DIFFERENTIAL/PLATELET
BASO%: 0.3 % (ref 0.0–2.0)
Basophils Absolute: 0 10*3/uL (ref 0.0–0.1)
EOS%: 3 % (ref 0.0–7.0)
Eosinophils Absolute: 0.1 10*3/uL (ref 0.0–0.5)
HCT: 40.6 % (ref 34.8–46.6)
HGB: 13.8 g/dL (ref 11.6–15.9)
LYMPH%: 40.7 % (ref 14.0–49.7)
MCH: 30.9 pg (ref 25.1–34.0)
MCHC: 34 g/dL (ref 31.5–36.0)
MCV: 90.8 fL (ref 79.5–101.0)
MONO#: 0.2 10*3/uL (ref 0.1–0.9)
MONO%: 7.1 % (ref 0.0–14.0)
NEUT#: 1.7 10*3/uL (ref 1.5–6.5)
NEUT%: 48.9 % (ref 38.4–76.8)
Platelets: 169 10*3/uL (ref 145–400)
RBC: 4.47 10*6/uL (ref 3.70–5.45)
RDW: 13.2 % (ref 11.2–14.5)
WBC: 3.4 10*3/uL — ABNORMAL LOW (ref 3.9–10.3)
lymph#: 1.4 10*3/uL (ref 0.9–3.3)
nRBC: 0 % (ref 0–0)

## 2009-12-17 LAB — CANCER ANTIGEN 27.29: CA 27.29: 12 U/mL (ref 0–39)

## 2009-12-17 LAB — VITAMIN D 25 HYDROXY (VIT D DEFICIENCY, FRACTURES): Vit D, 25-Hydroxy: 25 ng/mL — ABNORMAL LOW (ref 30–89)

## 2009-12-25 LAB — ESTRADIOL, ULTRA SENS

## 2010-01-25 ENCOUNTER — Ambulatory Visit: Payer: Self-pay | Admitting: Oncology

## 2010-03-09 ENCOUNTER — Ambulatory Visit: Payer: Self-pay | Admitting: Oncology

## 2010-04-26 ENCOUNTER — Ambulatory Visit: Payer: Self-pay | Admitting: Oncology

## 2010-06-07 ENCOUNTER — Ambulatory Visit: Payer: Self-pay | Admitting: Oncology

## 2010-06-16 LAB — CBC WITH DIFFERENTIAL/PLATELET
BASO%: 0.4 % (ref 0.0–2.0)
EOS%: 4.1 % (ref 0.0–7.0)
Eosinophils Absolute: 0.1 10*3/uL (ref 0.0–0.5)
HCT: 40.3 % (ref 34.8–46.6)
HGB: 13.8 g/dL (ref 11.6–15.9)
MCH: 31.3 pg (ref 25.1–34.0)
MONO#: 0.3 10*3/uL (ref 0.1–0.9)
MONO%: 8.3 % (ref 0.0–14.0)
NEUT#: 1.3 10*3/uL — ABNORMAL LOW (ref 1.5–6.5)
NEUT%: 41.7 % (ref 38.4–76.8)
RBC: 4.42 10*6/uL (ref 3.70–5.45)
WBC: 3 10*3/uL — ABNORMAL LOW (ref 3.9–10.3)
lymph#: 1.4 10*3/uL (ref 0.9–3.3)

## 2010-06-16 LAB — COMPREHENSIVE METABOLIC PANEL
ALT: 17 U/L (ref 0–35)
AST: 18 U/L (ref 0–37)
Albumin: 3.7 g/dL (ref 3.5–5.2)
Creatinine, Ser: 0.92 mg/dL (ref 0.40–1.20)
Glucose, Bld: 107 mg/dL — ABNORMAL HIGH (ref 70–99)
Potassium: 4 mEq/L (ref 3.5–5.3)

## 2010-06-16 LAB — CANCER ANTIGEN 27.29: CA 27.29: 31 U/mL (ref 0–39)

## 2010-06-27 LAB — ESTRADIOL, ULTRA SENS: Estradiol, Ultra Sensitive: <2 pg/mL

## 2010-09-13 ENCOUNTER — Ambulatory Visit: Payer: Self-pay | Admitting: Oncology

## 2010-11-01 ENCOUNTER — Ambulatory Visit (HOSPITAL_BASED_OUTPATIENT_CLINIC_OR_DEPARTMENT_OTHER): Payer: Self-pay | Admitting: Oncology

## 2010-11-27 ENCOUNTER — Encounter: Payer: Self-pay | Admitting: Family Medicine

## 2010-11-27 ENCOUNTER — Encounter (HOSPITAL_BASED_OUTPATIENT_CLINIC_OR_DEPARTMENT_OTHER): Payer: Self-pay | Admitting: General Surgery

## 2010-12-08 ENCOUNTER — Other Ambulatory Visit: Payer: Self-pay | Admitting: Oncology

## 2010-12-08 ENCOUNTER — Encounter (HOSPITAL_BASED_OUTPATIENT_CLINIC_OR_DEPARTMENT_OTHER): Payer: Self-pay | Admitting: Oncology

## 2010-12-08 DIAGNOSIS — C799 Secondary malignant neoplasm of unspecified site: Secondary | ICD-10-CM

## 2010-12-08 DIAGNOSIS — C50919 Malignant neoplasm of unspecified site of unspecified female breast: Secondary | ICD-10-CM

## 2010-12-08 DIAGNOSIS — C50319 Malignant neoplasm of lower-inner quadrant of unspecified female breast: Secondary | ICD-10-CM

## 2010-12-08 LAB — COMPREHENSIVE METABOLIC PANEL
ALT: 15 U/L (ref 0–35)
AST: 18 U/L (ref 0–37)
Albumin: 3.9 g/dL (ref 3.5–5.2)
Alkaline Phosphatase: 68 U/L (ref 39–117)
BUN: 8 mg/dL (ref 6–23)
CO2: 31 mEq/L (ref 19–32)
Calcium: 9.5 mg/dL (ref 8.4–10.5)
Chloride: 105 mEq/L (ref 96–112)
Creatinine, Ser: 0.95 mg/dL (ref 0.40–1.20)
Glucose, Bld: 85 mg/dL (ref 70–99)
Potassium: 3.9 mEq/L (ref 3.5–5.3)
Sodium: 143 mEq/L (ref 135–145)
Total Bilirubin: 0.8 mg/dL (ref 0.3–1.2)
Total Protein: 7.7 g/dL (ref 6.0–8.3)

## 2010-12-08 LAB — CBC WITH DIFFERENTIAL/PLATELET
BASO%: 0.3 % (ref 0.0–2.0)
Basophils Absolute: 0 10*3/uL (ref 0.0–0.1)
EOS%: 2.4 % (ref 0.0–7.0)
Eosinophils Absolute: 0.1 10*3/uL (ref 0.0–0.5)
HCT: 40.3 % (ref 34.8–46.6)
HGB: 13.9 g/dL (ref 11.6–15.9)
LYMPH%: 40.1 % (ref 14.0–49.7)
MCH: 31.1 pg (ref 25.1–34.0)
MONO#: 0.2 10*3/uL (ref 0.1–0.9)
NEUT#: 1.9 10*3/uL (ref 1.5–6.5)
NEUT%: 51.2 % (ref 38.4–76.8)
Platelets: 210 10*3/uL (ref 145–400)
RDW: 14.1 % (ref 11.2–14.5)
WBC: 3.7 10*3/uL — ABNORMAL LOW (ref 3.9–10.3)

## 2010-12-08 LAB — FOLLICLE STIMULATING HORMONE: FSH: 42.3 m[IU]/mL

## 2010-12-08 LAB — CANCER ANTIGEN 27.29: CA 27.29: 38 U/mL (ref 0–39)

## 2010-12-09 ENCOUNTER — Encounter (HOSPITAL_BASED_OUTPATIENT_CLINIC_OR_DEPARTMENT_OTHER): Payer: Self-pay

## 2010-12-09 ENCOUNTER — Ambulatory Visit: Payer: Self-pay | Admitting: Oncology

## 2010-12-09 DIAGNOSIS — C50319 Malignant neoplasm of lower-inner quadrant of unspecified female breast: Secondary | ICD-10-CM

## 2010-12-09 DIAGNOSIS — Z452 Encounter for adjustment and management of vascular access device: Secondary | ICD-10-CM

## 2010-12-15 LAB — ESTRADIOL, ULTRA SENS

## 2011-02-03 ENCOUNTER — Encounter (HOSPITAL_BASED_OUTPATIENT_CLINIC_OR_DEPARTMENT_OTHER): Payer: Self-pay | Admitting: Oncology

## 2011-02-03 DIAGNOSIS — Z452 Encounter for adjustment and management of vascular access device: Secondary | ICD-10-CM

## 2011-02-03 DIAGNOSIS — C50319 Malignant neoplasm of lower-inner quadrant of unspecified female breast: Secondary | ICD-10-CM

## 2011-03-24 NOTE — Op Note (Signed)
NAMEENEDINA, PAIR              ACCOUNT NO.:  [id number]   MEDICAL RECORD NO.:  [id number]          PATIENT TYPE:  OIB   LOCATION:  5710                         FACILITY:  [facility]   PHYSICIAN:  Leonie Man, M.D.   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   POSTOPERATIVE DIAGNOSES:  Carcinoma right breast (bilateral metastatic  carcinoma of the breast).   PROCEDURE:  Right modified radical mastectomy following sentinel lymph node  biopsy.   SURGEON:  Dr. Lurene Shadow.   ASSISTANT:  Dr. Violeta Gelinas.   ANESTHESIA:  General.   Cheryl Mendoza is a 60 year old female who originally presented with an advanced  left-sided breast cancer in the early part of this year.  She was treated  with neoadjuvant chemotherapy and preoperative radiation, and subsequently  underwent left-sided modified radical mastectomy.  She was continued on  tamoxifen when on mammogram showed was noted to have a new lesion in the  right breast, and on biopsy showed invasive carcinoma.  She comes to the  operating room now for right-sided mastectomy with the possibility of  sentinel lymph node biopsy, possible axillary lymph node dissection.  She  understands the risks and potential benefits of surgery and gives her  consent to same.   PROCEDURE:  Following induction of satisfactory general anesthesia, the  patient was positioned supinely and the right breast prepped and draped to  be included in a sterile operative field.  This was accomplished after  radionucleotide injection was carried out in the right subareolar region, as  well as the injection of blue dye.  Following prepping and draping, an  elliptical incision is made around the breast, deepened through skin, down  to the subcutaneous tissue with a superior flap being  raised to the clavicle  and medially to the sternal border, inferior flap raised down to the rectus  muscle and then laterally to the anterior border of the latissimus dorsi  muscle.  The patient had a Port-A-Cath placed, and this was avoided during  the course of dissection.  The breast thus mobilized, dissection was then  carried out into the axilla using the NeoProbe to locate the sentinel lymph  nodes.  There were two sentinel lymph nodes located.  These were dissected  free and forwarded for pathologic evaluation.  One sentinel lymph node noted  showed metastatic adenocarcinoma.  Operation was then converted to modified  radical mastectomy.  Flaps having been raised, the breast tissues were  dissected free from the anterior chest wall, taking the anterior pectoralis  fascia, carrying it laterally over the pectoralis major and minor muscles,  and over the serratus anterior muscles.  Dissection was then carried out up  into the axilla, starting at [facility] ligament and carrying the entire  axillary fat pad laterally and inferiorly down with transection of the  intercostal humeral nerve and preservation of long thoracic and  thoracodorsal nerves.  The entire specimen was then  removed and forwarded  for pathologic evaluation.  Hemostasis and assured with electrocautery over  the clamps and ties of 2-0 silk.  The breast wound was then thoroughly  irrigated with multiple aliquots of normal saline.  Sponge, instrument and  sharp counts were verified.  Two #19 Blake drains were placed under the  flaps and the wound closed in layers as follows:  Subcutaneous tissues closed with interrupted 2-0 Vicryl sutures.  Skin was  closed with staples.  A sterile compressive dressing applied, after the  drains had been secured to the skin with 3-0 nylon sutures.  The patient was  then removed from the operating room to the recovery room in stable  condition.  She tolerated the procedure well.       Leonie Man, M.D.  Electronically Signed     PB/MEDQ  D:  05/21/2006  T:  05/22/2006  Job:  [id number]   cc:   Leonie Man, M.D.  [address]  [address]  [place]  [place] [address]

## 2011-03-24 NOTE — Discharge Summary (Signed)
NAMEHELLENA, PRIDGEN              ACCOUNT NO.:  [id number]   MEDICAL RECORD NO.:  [id number]          PATIENT TYPE:  INP   LOCATION:  1611                         FACILITY:  [facility]   PHYSICIAN:  Leonie Man, M.D.   DATE OF BIRTH:  Jan 27, 1951   DATE OF ADMISSION:  09/11/2005  DATE OF DISCHARGE:  09/19/2005                                 DISCHARGE SUMMARY   ADMISSION DIAGNOSIS:  Left chest wall wound infection and skin flap  necrosis, status post left total mastectomy.   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   PROCEDURE:  1.  Incision and drainage and debridement of left chest wall infection.  2.  Wet-to-dry saline gauze dressings.  3.  Wound VAC closure.   COMPLICATIONS:  None.   CONDITION ON DISCHARGE:  Improved.   ANTIBIOTICS USED WHILE IN HOSPITAL:  Tetracycline, switched to trimethoprim  sulfa.  Cultures grew out methicillin-sensitive Staph aureus.   DISCHARGE DIET:  No restriction.   FOLLOWUP:  In the office in 10 days.   HOSPITAL COURSE:  The patient is a 60 year old woman with diagnosed advanced  breast cancer who underwent left-sided total mastectomy.  She was doing  well.  However, developed a wound infection of the left breast with skin  necrosis of her flaps, which was probably secondary to radiation therapy,  which she received prior to surgery.  Following admission, she underwent the  above management and treatments.  She is being discharged now to be followed  up in the office in approximately 10 days to two weeks.  She is being  discharged with the visiting nurse service for continued use of the wound  VAC to help close her wound.  Following use of the wound VAC, we will  reevaluate her wound to see whether or not she will require skin grafts.      Leonie Man, M.D.  Electronically Signed     PB/MEDQ  D:  09/19/2005  T:  09/19/2005  Job:  [id number]

## 2011-06-19 ENCOUNTER — Ambulatory Visit (HOSPITAL_COMMUNITY)
Admission: RE | Admit: 2011-06-19 | Discharge: 2011-06-19 | Disposition: A | Discharge status: Home / Self Care | Payer: Self-pay | Source: Ambulatory Visit | Attending: Oncology | Admitting: Oncology

## 2011-06-19 DIAGNOSIS — M5030 Other cervical disc degeneration, unspecified cervical region: Secondary | ICD-10-CM | POA: Insufficient documentation

## 2011-06-19 DIAGNOSIS — M47812 Spondylosis without myelopathy or radiculopathy, cervical region: Secondary | ICD-10-CM | POA: Insufficient documentation

## 2011-06-19 DIAGNOSIS — J984 Other disorders of lung: Secondary | ICD-10-CM | POA: Insufficient documentation

## 2011-06-19 DIAGNOSIS — C50919 Malignant neoplasm of unspecified site of unspecified female breast: Secondary | ICD-10-CM | POA: Insufficient documentation

## 2011-06-19 DIAGNOSIS — C799 Secondary malignant neoplasm of unspecified site: Secondary | ICD-10-CM

## 2011-06-19 DIAGNOSIS — K7689 Other specified diseases of liver: Secondary | ICD-10-CM | POA: Insufficient documentation

## 2011-06-19 MED ORDER — IOHEXOL 300 MG/ML  SOLN
100.0000 mL | Freq: Once | INTRAMUSCULAR | Status: AC | PRN
  Administered 2011-06-19: 100 mL via INTRAVENOUS

## 2011-06-21 ENCOUNTER — Other Ambulatory Visit: Payer: Self-pay | Admitting: Oncology

## 2011-06-21 ENCOUNTER — Encounter (HOSPITAL_BASED_OUTPATIENT_CLINIC_OR_DEPARTMENT_OTHER): Payer: Self-pay | Admitting: Oncology

## 2011-06-21 DIAGNOSIS — Z452 Encounter for adjustment and management of vascular access device: Secondary | ICD-10-CM

## 2011-06-21 DIAGNOSIS — C50319 Malignant neoplasm of lower-inner quadrant of unspecified female breast: Secondary | ICD-10-CM

## 2011-06-21 DIAGNOSIS — C50919 Malignant neoplasm of unspecified site of unspecified female breast: Secondary | ICD-10-CM

## 2011-06-21 LAB — CBC WITH DIFFERENTIAL/PLATELET
Eosinophils Absolute: 0.2 10*3/uL (ref 0.0–0.5)
LYMPH%: 44.8 % (ref 14.0–49.7)
MCV: 91.4 fL (ref 79.5–101.0)
MONO%: 9.1 % (ref 0.0–14.0)
NEUT#: 1.5 10*3/uL (ref 1.5–6.5)
Platelets: 188 10*3/uL (ref 145–400)
RBC: 4.25 10*6/uL (ref 3.70–5.45)

## 2011-06-21 LAB — COMPREHENSIVE METABOLIC PANEL
Alkaline Phosphatase: 77 U/L (ref 39–117)
BUN: 12 mg/dL (ref 6–23)
Glucose, Bld: 87 mg/dL (ref 70–99)
Sodium: 141 mEq/L (ref 135–145)
Total Bilirubin: 0.5 mg/dL (ref 0.3–1.2)
Total Protein: 7.4 g/dL (ref 6.0–8.3)

## 2011-06-21 LAB — CANCER ANTIGEN 27.29: CA 27.29: 48 U/mL — ABNORMAL HIGH (ref 0–39)

## 2011-07-06 ENCOUNTER — Encounter (HOSPITAL_COMMUNITY): Payer: Self-pay

## 2011-07-06 ENCOUNTER — Encounter (HOSPITAL_COMMUNITY)
Admission: RE | Admit: 2011-07-06 | Discharge: 2011-07-06 | Disposition: A | Discharge status: Home / Self Care | Payer: Self-pay | Source: Ambulatory Visit | Attending: Oncology | Admitting: Oncology

## 2011-07-06 DIAGNOSIS — C50919 Malignant neoplasm of unspecified site of unspecified female breast: Secondary | ICD-10-CM | POA: Insufficient documentation

## 2011-07-06 DIAGNOSIS — Z923 Personal history of irradiation: Secondary | ICD-10-CM | POA: Insufficient documentation

## 2011-07-06 DIAGNOSIS — Z9010 Acquired absence of unspecified breast and nipple: Secondary | ICD-10-CM | POA: Insufficient documentation

## 2011-07-06 DIAGNOSIS — J984 Other disorders of lung: Secondary | ICD-10-CM | POA: Insufficient documentation

## 2011-07-06 HISTORY — DX: Malignant (primary) neoplasm, unspecified: C80.1

## 2011-07-06 MED ORDER — FLUDEOXYGLUCOSE F - 18 (FDG) INJECTION
14.6000 | Freq: Once | INTRAVENOUS | Status: AC | PRN
  Administered 2011-07-06: 14.6 via INTRAVENOUS

## 2011-09-26 ENCOUNTER — Other Ambulatory Visit: Payer: Self-pay | Admitting: Oncology

## 2011-09-26 ENCOUNTER — Ambulatory Visit (HOSPITAL_BASED_OUTPATIENT_CLINIC_OR_DEPARTMENT_OTHER): Payer: Self-pay

## 2011-09-26 ENCOUNTER — Other Ambulatory Visit (HOSPITAL_BASED_OUTPATIENT_CLINIC_OR_DEPARTMENT_OTHER): Payer: Self-pay | Admitting: Lab

## 2011-09-26 ENCOUNTER — Other Ambulatory Visit (HOSPITAL_COMMUNITY): Payer: Self-pay

## 2011-09-26 DIAGNOSIS — Z452 Encounter for adjustment and management of vascular access device: Secondary | ICD-10-CM

## 2011-09-26 DIAGNOSIS — C50319 Malignant neoplasm of lower-inner quadrant of unspecified female breast: Secondary | ICD-10-CM

## 2011-09-26 LAB — COMPREHENSIVE METABOLIC PANEL
Albumin: 3.6 g/dL (ref 3.5–5.2)
Alkaline Phosphatase: 72 U/L (ref 39–117)
BUN: 15 mg/dL (ref 6–23)
Calcium: 9.4 mg/dL (ref 8.4–10.5)
Glucose, Bld: 90 mg/dL (ref 70–99)
Potassium: 3.4 mEq/L — ABNORMAL LOW (ref 3.5–5.3)

## 2011-09-26 LAB — CBC WITH DIFFERENTIAL/PLATELET
Basophils Absolute: 0 10*3/uL (ref 0.0–0.1)
Eosinophils Absolute: 0.1 10*3/uL (ref 0.0–0.5)
HCT: 38.2 % (ref 34.8–46.6)
HGB: 12.9 g/dL (ref 11.6–15.9)
MCV: 89.7 fL (ref 79.5–101.0)
MONO%: 5.7 % (ref 0.0–14.0)
NEUT#: 1.3 10*3/uL — ABNORMAL LOW (ref 1.5–6.5)
NEUT%: 35.8 % — ABNORMAL LOW (ref 38.4–76.8)
Platelets: 186 10*3/uL (ref 145–400)
RDW: 13.5 % (ref 11.2–14.5)

## 2011-09-26 MED ORDER — HEPARIN SOD (PORK) LOCK FLUSH 100 UNIT/ML IV SOLN
500.0000 [IU] | Freq: Once | INTRAVENOUS | Status: AC
  Administered 2011-09-26: 500 [IU] via INTRAVENOUS
  Filled 2011-09-26: qty 5

## 2011-09-26 MED ORDER — SODIUM CHLORIDE 0.9 % IJ SOLN
10.0000 mL | INTRAMUSCULAR | Status: DC | PRN
  Administered 2011-09-26: 10 mL via INTRAVENOUS
  Filled 2011-09-26: qty 10

## 2011-10-02 ENCOUNTER — Encounter: Payer: Self-pay | Admitting: *Deleted

## 2011-10-03 ENCOUNTER — Ambulatory Visit (HOSPITAL_BASED_OUTPATIENT_CLINIC_OR_DEPARTMENT_OTHER): Payer: Self-pay | Admitting: Oncology

## 2011-10-03 VITALS — BP 139/91 | HR 74 | Temp 98.4°F | Ht 61.5 in | Wt 192.3 lb

## 2011-10-03 DIAGNOSIS — Z170 Estrogen receptor positive status [ER+]: Secondary | ICD-10-CM

## 2011-10-03 DIAGNOSIS — C50919 Malignant neoplasm of unspecified site of unspecified female breast: Secondary | ICD-10-CM

## 2011-10-03 DIAGNOSIS — C50319 Malignant neoplasm of lower-inner quadrant of unspecified female breast: Secondary | ICD-10-CM

## 2011-10-05 ENCOUNTER — Encounter: Payer: Self-pay | Admitting: Oncology

## 2011-10-05 NOTE — Progress Notes (Signed)
ID: Dannielle Huh   Interval History: Ms. Kalama returns today for followup of her breast cancer. The interval history is generally unremarkable.What is really worrying her is her family in [place]. She says they're all living in one house, the roof leaks, and none of them can find jobs, even though one is trained as a Engineer, civil (consulting), a couple of others completed school, etc. They sound like an ambitious and industrious family that is not thriving because of a difficult environment.  ROS:  She herself is doing well. She does a lot of cooking and cleaning and working in the garden. She grows many vegetables. She does not otherwise exercise. She denies any unusual headaches visual changes, cough, phlegm production, pleurisy, shortness of breath, change in bowel or bladder habits, or any other symptoms. In fact her detailed review of systems today was entirely benign.  Medications: I have reviewed the patient's current medications.  Current Outpatient Prescriptions  Medication Sig Dispense Refill  . exemestane (AROMASIN) 25 MG tablet Take 25 mg by mouth daily after breakfast.        . ketoconazole (NIZORAL) 2 % cream Apply 1 application topically daily.        [name] triamterene-hydrochlorothiazide (DYAZIDE) 37.5-25 MG per capsule Take 1 capsule by mouth every morning.           Objective: Vital signs in last 24 hours: BP 139/91  Pulse 74  Temp(Src) 98.4 F (36.9 C) (Oral)  Ht 5' 1.5" (1.562 m)  Wt 192 lb 4.8 oz (87.227 kg)  BMI 35.75 kg/m2   Physical Exam:    Sclerae unicteric  Oropharynx clear  No peripheral adenopathy  Lungs clear -- no rales or rhonchi  Heart regular rate and rhythm  Abdomen benign  MSK no focal spinal tenderness, no peripheral edema  Neuro nonfocal  Breast exam: She status post bilateral mastectomies. There is no evidence of local recurrence on either side.  Lab Results: Her CA 27-29, which previously had been normal, rose to 48 in August. She was restaged at that time  as detailed below. All the studies were negative. Repeat CA 27-29 now is 45.  CMP      Component Value Date/Time   NA 138 09/26/2011 0920   K 3.4* 09/26/2011 0920   CL 100 09/26/2011 0920   CO2 31 09/26/2011 0920   GLUCOSE 90 09/26/2011 0920   BUN 15 09/26/2011 0920   CREATININE 0.92 09/26/2011 0920   CALCIUM 9.4 09/26/2011 0920   PROT 7.4 09/26/2011 0920   ALBUMIN 3.6 09/26/2011 0920   AST 16 09/26/2011 0920   ALT 13 09/26/2011 0920   ALKPHOS 72 09/26/2011 0920   BILITOT 0.3 09/26/2011 0920    CBC Lab Results  Component Value Date   WBC 3.7* 09/26/2011   HGB 12.9 09/26/2011   HCT 38.2 09/26/2011   MCV 89.7 09/26/2011   PLT 186 09/26/2011    Studies/Results:  She had a PET scan August of 2012 which showed no evidence of metastatic disease. CT of the chest obtained August as well had shown a nodular left upper lobe lesion. This was not FDG avid on PET. CT of the neck in August was likewise negative.  Assessment: 60 year old [place] woman originally from [place],  (1) status post left breast biopsy March of 2006 for a fungating invasive ductal carcinoma, grade 2, with a pathologically positive lymph node at presentation, (T4 N1, or Stage IIID) treated neoadjuvantly with doxorubicin and cyclophosphamide x4 in dose dense fashion followed by  docetaxel x6 given weekly, the final pathology showing a 6.2 cm multicentric tumor, no ALND, and so ypT3 ypNX, estrogen receptor 20% positive, breast progesterone receptor negative, MIB-1 at 6%, HercepTest 2+ but non-amplified by FISH; post-mastectomy irradiation completed Sept 2006 at which time the patient was started on tamoxifen  (2) status post Right modified radical mastectomy July of 2007 for a 7 mm invasive ductal carcinoma, grade 1, involving 14/24 lymph nodes Sampled (T1b N3 or Stage IIIC), estrogen receptor 85% positive, progesterone receptor 4% positive, with an MIB-1 of 5%, and HercepTest equivocal at 2+ but again no  amplification by [facility]; post-mastectomy irradiation completed October 2007, at which time she was started on exemestane, on which she continues  (3) s/p ovarian irradiation to induce menopause October 2007  (4) s/p trastuzumab (both tumors showed significant polysomy) August 2007 to April 2008, discontinued because of a mild drop in EF, which had resolved per echo August 2008     Plan: We are going to continue the exemestane indefinitely. Though there has been no trend in the CA 27-29 I remain concerned and so we will see her again in 4 months instead of 6. Of course we will repeat the lab work before that visit.  Because of her family concerns Edwina is considering returning to [place]. She requests a letter from me stating that she will need to return [place] to receive proper care. I will be glad to provide that for her.  Cristan Scherzer C 10/05/2011

## 2011-12-11 ENCOUNTER — Telehealth: Payer: Self-pay | Admitting: Oncology

## 2011-12-11 ENCOUNTER — Other Ambulatory Visit: Payer: Self-pay | Admitting: Oncology

## 2011-12-11 DIAGNOSIS — C50919 Malignant neoplasm of unspecified site of unspecified female breast: Secondary | ICD-10-CM

## 2011-12-11 NOTE — Telephone Encounter (Signed)
S/w the pt's husband and he is aware of the march 2013 appts °

## 2012-01-15 ENCOUNTER — Other Ambulatory Visit (HOSPITAL_BASED_OUTPATIENT_CLINIC_OR_DEPARTMENT_OTHER): Payer: Self-pay | Admitting: Lab

## 2012-01-15 ENCOUNTER — Telehealth: Payer: Self-pay | Admitting: *Deleted

## 2012-01-15 ENCOUNTER — Ambulatory Visit (HOSPITAL_BASED_OUTPATIENT_CLINIC_OR_DEPARTMENT_OTHER): Payer: Self-pay | Admitting: Oncology

## 2012-01-15 VITALS — BP 141/84 | HR 76 | Temp 98.3°F | Ht 61.5 in | Wt 201.5 lb

## 2012-01-15 DIAGNOSIS — C50919 Malignant neoplasm of unspecified site of unspecified female breast: Secondary | ICD-10-CM | POA: Insufficient documentation

## 2012-01-15 LAB — CANCER ANTIGEN 27.29: CA 27.29: 40 U/mL — ABNORMAL HIGH (ref 0–39)

## 2012-01-15 LAB — COMPREHENSIVE METABOLIC PANEL
Albumin: 4 g/dL (ref 3.5–5.2)
CO2: 26 mEq/L (ref 19–32)
Glucose, Bld: 115 mg/dL — ABNORMAL HIGH (ref 70–99)
Potassium: 3.8 mEq/L (ref 3.5–5.3)
Sodium: 141 mEq/L (ref 135–145)
Total Protein: 6.9 g/dL (ref 6.0–8.3)

## 2012-01-15 LAB — CBC WITH DIFFERENTIAL/PLATELET
Eosinophils Absolute: 0.1 10*3/uL (ref 0.0–0.5)
MONO#: 0.3 10*3/uL (ref 0.1–0.9)
NEUT#: 1.6 10*3/uL (ref 1.5–6.5)
Platelets: 199 10*3/uL (ref 145–400)
RBC: 4.11 10*6/uL (ref 3.70–5.45)
RDW: 13.7 % (ref 11.2–14.5)
WBC: 3.5 10*3/uL — ABNORMAL LOW (ref 3.9–10.3)

## 2012-01-15 MED ORDER — TRIAMTERENE-HCTZ 37.5-25 MG PO CAPS: 1.0000 | ORAL_CAPSULE | ORAL | Status: AC

## 2012-01-15 NOTE — Progress Notes (Signed)
ID: Cheryl Mendoza   DOB: April 11, 1951  MR#: [id number]  WUJ#:[id number]  HISTORY OF PRESENT ILLNESS: The patient tells me she first noted a problem in her left breast in February of 2004 when she was in [place].  She went to the hospital there, and they put her on some pills.  However, the pills did not help.    She came to the [place] in November of 05 and on March 8th, she was scheduled for screening mammography by [facility]. This showed a possible subareolar mass with nipple retraction in the left breast.  On the 16th, additional views were obtained as well as a fuller physical exam. On exam, there was a large, firm mass in the subareolar region and the inner half of the left breast with marked skin thickening peau d'orange and hyperpigmentation.  The breast was very firm and spot compression mammographic views could not be obtained.  Sonography, however, showed diffuse hypoechoic mass throughout the subareolar region in the left lower inner quadrant.  In the left axilla, there was a spiculated mass measuring up to 1.1 cm and also several abnormal lymph nodes, with decrease in the normal fatty hilus and a thickened cortex.  On the same day, the patient had ultrasound-guided biopsies of both the breast mass and the left axillary mass, and this showed ([id number]) a low to intermediate grade breast cancer with no definitive evidence of lymphovascular invasion, but with a positive lymph node biopsy. The tumor cells were ER positive at 18%, PR negative and HER-2/neu negative by FISH.    The patient's subsequent history is summarized below  INTERVAL HISTORY: The patient is currently living with her sister's son in [place]. He works at the airport there. The patient does be housework, and cooking. She likes being in [place] because there is "a grandmother in the same building" and a spend some time together every day. She came to [facility] by bus and her sister drove her to the appointment  today.  REVIEW OF SYSTEMS: She has rare headaches, perhaps a once a month or so, which she takes Tylenol for if needed. Sometimes she has low back pain. This is very intermittent perhaps 2 or 3 times a year. 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 Otherwise the detailed review of systems was noncontributory.  PAST MEDICAL HISTORY: Past Medical History  Diagnosis Date  . Cancer   Significant for bilateral tubal ligation and being status post removal of a filarial nodule from the left lower back remotely.    PAST SURGICAL HISTORY: No past surgical history on file.  FAMILY HISTORY The patient's father died at the age of 32 from compilations of hypertension and diabetes.  The patient's mother died at the age of 30 from cancer of the pancreas. The patient's sister, Comfort, is present today.  The patient has a brother, Remi Deter, also in the [place], and two half brothers, Rella Larve and Reuel Boom, also in the [place].    GYNECOLOGIC HISTORY: The patient is G7, P6.  Her six surviving children live in [place] and they are between the ages of 20 and 54.    SOCIAL HISTORY: The patient lives with her sister, Comfort, and Comfort's husband. Comfort is a Physicist, medical at [employer] and her husband works in Production designer, theatre/television/film for [employer]. They tell me they have a niece at [employer] studying medicine right now.  The patient is a member  of the [employer]   ADVANCED DIRECTIVES:  HEALTH MAINTENANCE: History  Substance Use Topics  . Smoking status: Not on file  . Smokeless tobacco: Not on file  . Alcohol Use: Not on file     Colonoscopy:  PAP:  Bone density:  Lipid panel:  Allergies no known allergies  Current Outpatient Prescriptions  Medication Sig Dispense Refill  . exemestane (AROMASIN) 25 MG tablet Take 25 mg by mouth daily after breakfast.        . ketoconazole (NIZORAL) 2 % cream Apply  1 application topically daily.        [name] triamterene-hydrochlorothiazide (DYAZIDE) 37.5-25 MG per capsule Take 1 each (1 capsule total) by mouth every morning.  90 capsule  12    OBJECTIVE: Middle-aged African woman who appears well Filed Vitals:   01/15/12 1241  BP: 141/84  Pulse: 76  Temp: 98.3 F (36.8 C)     Body mass index is 37.46 kg/(m^2).    ECOG FS: 0 Sclerae unicteric Oropharynx clear No peripheral adenopathy Lungs no rales or rhonchi Heart regular rate and rhythm Abd benign MSK no focal spinal tenderness, no peripheral edema Neuro: nonfocal Breasts: Status post bilateral mastectomies. No evidence of local recurrence per  LAB RESULTS: Lab Results  Component Value Date   WBC 3.7* 09/26/2011   NEUTROABS 1.3* 09/26/2011   HGB 12.9 09/26/2011   HCT 38.2 09/26/2011   MCV 89.7 09/26/2011   PLT 186 09/26/2011      Chemistry      Component Value Date/Time   NA 138 09/26/2011 0920   K 3.4* 09/26/2011 0920   CL 100 09/26/2011 0920   CO2 31 09/26/2011 0920   BUN 15 09/26/2011 0920   CREATININE 0.92 09/26/2011 0920      Component Value Date/Time   CALCIUM 9.4 09/26/2011 0920   ALKPHOS 72 09/26/2011 0920   AST 16 09/26/2011 0920   ALT 13 09/26/2011 0920   BILITOT 0.3 09/26/2011 0920       Lab Results  Component Value Date   LABCA2 45* 09/26/2011    No results found for this basename: INR:1;PROTIME:1 in the last 168 hours  No results found for this basename: UACOL:1,UAPR:1,USPG:1,UPH:1,UTP:1,UGL:1,UKET:1,UBIL:1,UHGB:1,UNIT:1,UROB:1,ULEU:1,UEPI:1,UWBC:1,URBC:1,UBAC:1,CAST:1,CRYS:1,UCOM:1,BILUA:1 in the last 72 hours   STUDIES: No results found.  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  Treated neoadjuvantly with doxorubicin and cyclophosphamide x4 given in dose-dense fashion and followed by  paclitaxel x6.  Received post mastectomy radiation and received trastuzumab from August 2007 until April 2008.  Tumor was HER-2/neu equivocal.  Also started on tamoxifen after the completion of radiation therapy.  (2) Right breast carcinoma initially biopsied in June 2007, status post right modified radical mastectomy in July 2007 for a 7-mm invasive ductal carcinoma, grade 1, with 15 of 27 lymph nodes involved.  Tumor was ER positive, weakly PR positive, HER-2/neu equivocal with low proliferation fraction.  Status post radiation including pelvic radiation to induce ovarian castration, then started on exemestane in October 2007 on which she continues.   PLAN: Maye is planning to return to [place]. I would like her to stay on exemestane indefinitely. She will continue to receive the medication through July of this year. I have asked her sister, Comfort, to let me know when the medication stops coming so we can find another supply for [place].  She will need to have her port  removed. This was placed by Dr. Lurene Shadow, who has since retired. We will ask 1 of the surgeons a central [facility] surgery to remove the port as a courtesy to the patient.  I am not making a return appointment here for [facility] but she knows that we'll always be glad to see her. I gave her my card so if she gets into trouble while in [place] they can call us. I also gave my card to her sister as noted above.   Trino Higinbotham C    01/15/2012

## 2012-01-15 NOTE — Telephone Encounter (Signed)
gave patient appointment for 05-20-2012 starting at 1:30 pm printed out calendar and gave to the patient instructed the patient to have a chest x-ray done before she comes back and see dr. Darnelle Catalan

## 2012-01-22 ENCOUNTER — Ambulatory Visit: Payer: Self-pay | Admitting: Oncology

## 2012-02-05 ENCOUNTER — Telehealth: Payer: Self-pay | Admitting: Oncology

## 2012-02-05 NOTE — Telephone Encounter (Signed)
S/w chappelle from dr balen's office regarding the pt needing a port removed. Per chappelle the appt is scheduled for 02/27/2012@10 :30am. S/w the pt and she is aware.

## 2012-02-27 ENCOUNTER — Encounter (INDEPENDENT_AMBULATORY_CARE_PROVIDER_SITE_OTHER): Payer: Self-pay | Admitting: General Surgery

## 2012-03-04 ENCOUNTER — Ambulatory Visit (INDEPENDENT_AMBULATORY_CARE_PROVIDER_SITE_OTHER): Payer: Self-pay | Admitting: General Surgery

## 2012-03-04 ENCOUNTER — Encounter (INDEPENDENT_AMBULATORY_CARE_PROVIDER_SITE_OTHER): Payer: Self-pay | Admitting: General Surgery

## 2012-03-04 VITALS — BP 130/84 | HR 73 | Temp 97.4°F | Ht 63.0 in | Wt 199.8 lb

## 2012-03-04 DIAGNOSIS — C50919 Malignant neoplasm of unspecified site of unspecified female breast: Secondary | ICD-10-CM

## 2012-03-04 NOTE — Progress Notes (Signed)
Chief Complaint  Patient presents with  . Pre-op Exam    eval PAC removal    HISTORY: Pt is s/p  Bilateral mastectomies for left breast cancer in 2006 and Right breast cancer in 2007.  She received chemotherapy at that time.  She is planning on returning to [place] where she was born, and would not want to go back with that foreign body in place.  She has not had any trouble with it. She has no evidence of disease now, and is going to be on exemestane long term as long as she can get it.    Past Medical History  Diagnosis Date  . Cancer     Past Surgical History  Procedure Date  . Port a cath insert   Bilateral mastectomies.  Current Outpatient Prescriptions  Medication Sig Dispense Refill  . exemestane (AROMASIN) 25 MG tablet Take 25 mg by mouth daily after breakfast.        . triamterene-hydrochlorothiazide (DYAZIDE) 37.5-25 MG per capsule Take 1 each (1 capsule total) by mouth every morning.  90 capsule  12     No Known Allergies   Family History  Problem Relation Age of Onset  . Cancer Mother     pancreatic  . Cancer Father     prostate     History   Social History  . Marital Status: Married    Spouse Name: N/A    Number of Children: N/A  . Years of Education: N/A   Social History Main Topics  . Smoking status: Never Smoker   . Smokeless tobacco: None  . Alcohol Use: No  . Drug Use: No  . Sexually Active:    Other Topics Concern  . None   Social History Narrative  . None     REVIEW OF SYSTEMS - PERTINENT POSITIVES ONLY: 12 point review of systems negative other than HPI and PMH.  EXAM: Filed Vitals:   03/04/12 0859  BP: 130/84  Pulse: 73  Temp: 97.4 F (36.3 C)    Gen:  No acute distress.  Well nourished and well groomed.   Neurological: Alert and oriented to person, place, and time. Coordination normal.  Head: Normocephalic and atraumatic.  Eyes: Conjunctivae are normal. Pupils are equal, round, and reactive to light. No scleral  icterus.  Neck: Normal range of motion. Neck supple. No tracheal deviation or thyromegaly present.  Cardiovascular: Normal rate, regular rhythm, normal heart sounds and intact distal pulses.  Exam reveals no gallop and no friction rub.  No murmur heard. Respiratory: Effort normal.  No respiratory distress. No chest wall tenderness. Breath sounds normal.  No wheezes, rales or rhonchi.  Breasts:  Surgically absent. GI: Soft. Bowel sounds are normal. The abdomen is soft and nontender.  There is no rebound and no guarding.  Musculoskeletal: Normal range of motion. Extremities are nontender.  Lymphadenopathy: No cervical, preauricular, postauricular or axillary adenopathy is present Skin: Skin is warm and dry. No rash noted. No diaphoresis. No erythema. No pallor. No clubbing, cyanosis, or edema.   Psychiatric: Normal mood and affect. Behavior is normal. Judgment and thought content normal.     ASSESSMENT AND PLAN: Breast cancer Pt is now 6 years out from second breast cancer. Plan removal of port a cath.  Discussed risks, benefits with patient.   Reviewed risk of bleeding, infection, fluid build up. Reviewed post op restrictions of 1 week no strenuous activity or heavy lifting.        Saia Derossett L Caterra Ostroff MD   Surgical Oncology, General and Endocrine Surgery [facility]      Visit Diagnoses: 1. Breast cancer     Primary Care Physician: No primary provider on file.    

## 2012-03-04 NOTE — Patient Instructions (Signed)
Anticipate risks of bleeding, infection, fluid accumulation under the skin.  These risks are uncommon, but do occur.  Also, there is an extremely rare instance of injury to the underlying blood vessel that the catheter is located in.    You will need to be off aspirin-containing products, ibuprofen, or aleve 1 week before surgery.

## 2012-03-04 NOTE — Assessment & Plan Note (Signed)
Pt is now 6 years out from second breast cancer. Plan removal of port a cath.  Discussed risks, benefits with patient.   Reviewed risk of bleeding, infection, fluid build up. Reviewed post op restrictions of 1 week no strenuous activity or heavy lifting.

## 2012-03-13 ENCOUNTER — Encounter (HOSPITAL_COMMUNITY): Payer: Self-pay | Admitting: Pharmacy Technician

## 2012-03-19 ENCOUNTER — Telehealth (INDEPENDENT_AMBULATORY_CARE_PROVIDER_SITE_OTHER): Payer: Self-pay

## 2012-03-19 ENCOUNTER — Encounter (HOSPITAL_COMMUNITY): Payer: Self-pay

## 2012-03-19 ENCOUNTER — Encounter (HOSPITAL_COMMUNITY)
Admission: RE | Admit: 2012-03-19 | Discharge: 2012-03-19 | Disposition: A | Discharge status: Home / Self Care | Payer: Self-pay | Source: Ambulatory Visit | Attending: General Surgery | Admitting: General Surgery

## 2012-03-19 ENCOUNTER — Ambulatory Visit (HOSPITAL_COMMUNITY)
Admission: RE | Admit: 2012-03-19 | Discharge: 2012-03-19 | Disposition: A | Discharge status: Home / Self Care | Payer: Self-pay | Source: Ambulatory Visit | Attending: General Surgery | Admitting: General Surgery

## 2012-03-19 DIAGNOSIS — Z01818 Encounter for other preprocedural examination: Secondary | ICD-10-CM | POA: Insufficient documentation

## 2012-03-19 DIAGNOSIS — R9431 Abnormal electrocardiogram [ECG] [EKG]: Secondary | ICD-10-CM | POA: Insufficient documentation

## 2012-03-19 DIAGNOSIS — J90 Pleural effusion, not elsewhere classified: Secondary | ICD-10-CM | POA: Insufficient documentation

## 2012-03-19 DIAGNOSIS — Z01812 Encounter for preprocedural laboratory examination: Secondary | ICD-10-CM | POA: Insufficient documentation

## 2012-03-19 DIAGNOSIS — Z01810 Encounter for preprocedural cardiovascular examination: Secondary | ICD-10-CM | POA: Insufficient documentation

## 2012-03-19 DIAGNOSIS — Z853 Personal history of malignant neoplasm of breast: Secondary | ICD-10-CM | POA: Insufficient documentation

## 2012-03-19 HISTORY — DX: Essential (primary) hypertension: I10

## 2012-03-19 LAB — CBC
HCT: 41.2 % (ref 36.0–46.0)
Hemoglobin: 14.1 g/dL (ref 12.0–15.0)
MCH: 31.2 pg (ref 26.0–34.0)
MCHC: 34.2 g/dL (ref 30.0–36.0)
MCV: 91.2 fL (ref 78.0–100.0)
RDW: 12.8 % (ref 11.5–15.5)

## 2012-03-19 LAB — BASIC METABOLIC PANEL
BUN: 13 mg/dL (ref 6–23)
Creatinine, Ser: 1.05 mg/dL (ref 0.50–1.10)
GFR calc non Af Amer: 57 mL/min — ABNORMAL LOW (ref >90)
Glucose, Bld: 95 mg/dL (ref 70–99)
Potassium: 3.3 mEq/L — ABNORMAL LOW (ref 3.5–5.1)

## 2012-03-19 LAB — SURGICAL PCR SCREEN: Staphylococcus aureus: POSITIVE — AB

## 2012-03-19 NOTE — Pre-Procedure Instructions (Signed)
CBC, BMET, EKG AND CXR WERE DONE TODAY PREOP AT [facility].  PT DID NOT WANT TO SIGH HER SURGICAL CONSENT TODAY--WANTS TO WAIT UNTIL DAY OF SURGERY-HER SISTER COMFORT WILL BE WITH HER AND STATES HER SISTER WILL HELP HER IN UNDERSTANDING THE CONSENT.  BERNIE AT DR. BYERLY'S OFFICE NOTIFIED PT'S PREOP CXR REPORT SHOWS SMALL LEFT PLEURAL EFFUSION--REPORT IS IN EPIC FOR DR. BYERLY TO REVIEW.

## 2012-03-19 NOTE — Patient Instructions (Signed)
YOUR SURGERY IS SCHEDULED ON: Wednesday  5/15  AT 8:30 AM  REPORT TO [facility] AT:  6:30 AM      PHONE # FOR SHORT STAY IS [phone]  DO NOT EAT OR DRINK ANYTHING AFTER MIDNIGHT THE NIGHT BEFORE YOUR SURGERY.  YOU MAY BRUSH YOUR TEETH, RINSE OUT YOUR MOUTH--BUT NO WATER, NO FOOD, NO CHEWING GUM, NO MINTS, NO CANDIES, NO CHEWING TOBACCO.  PLEASE TAKE THE FOLLOWING MEDICATIONS THE AM OF YOUR SURGERY WITH A FEW SIPS OF WATER:  NO MEDICINES YOU NEED TO TAKE THE AM OF YOUR SURGERY.    IF YOU USE INHALERS--USE YOUR INHALERS THE AM OF YOUR SURGERY AND BRING INHALERS TO THE HOSPITAL -TAKE TO SURGERY.    IF YOU ARE DIABETIC:  DO NOT TAKE ANY DIABETIC MEDICATIONS THE AM OF YOUR SURGERY.  IF YOU TAKE INSULIN IN THE EVENINGS--PLEASE ONLY TAKE 1/2 NORMAL EVENING DOSE THE NIGHT BEFORE YOUR SURGERY.  NO INSULIN THE AM OF YOUR SURGERY.  IF YOU HAVE SLEEP APNEA AND USE CPAP OR BIPAP--PLEASE BRING THE MASK --NOT THE MACHINE-NOT THE TUBING   -JUST THE MASK. DO NOT BRING VALUABLES, MONEY, CREDIT CARDS.  CONTACT LENS, DENTURES / PARTIALS, GLASSES SHOULD NOT BE WORN TO SURGERY AND IN MOST CASES-HEARING AIDS WILL NEED TO BE REMOVED.  BRING YOUR GLASSES CASE, ANY EQUIPMENT NEEDED FOR YOUR CONTACT LENS. FOR PATIENTS ADMITTED TO THE HOSPITAL--CHECK OUT TIME THE DAY OF DISCHARGE IS 11:00 AM.  ALL INPATIENT ROOMS ARE PRIVATE - WITH BATHROOM, TELEPHONE, TELEVISION AND WIFI INTERNET. IF YOU ARE BEING DISCHARGED THE SAME DAY OF YOUR SURGERY--YOU CAN NOT DRIVE YOURSELF HOME--AND SHOULD NOT GO HOME ALONE BY TAXI OR BUS.  NO DRIVING OR OPERATING MACHINERY FOR 24 HOURS FOLLOWING ANESTHESIA / PAIN MEDICATIONS.                            SPECIAL INSTRUCTIONS:  CHLORHEXIDINE SOAP SHOWER (other brand names are Betasept and Hibiclens ) PLEASE SHOWER WITH CHLORHEXIDINE THE NIGHT BEFORE YOUR SURGERY AND THE AM OF YOUR SURGERY. DO NOT USE CHLORHEXIDINE ON YOUR FACE OR PRIVATE AREAS--YOU MAY USE YOUR NORMAL SOAP  THOSE AREAS AND YOUR NORMAL SHAMPOO.  WOMEN SHOULD AVOID SHAVING UNDER ARMS AND SHAVING LEGS 48 HOURS BEFORE USING CHLORHEXIDINE TO AVOID SKIN IRRITATION.  DO NOT USE IF ALLERGIC TO CHLORHEXIDINE.  PLEASE READ OVER ANY  FACT SHEETS THAT YOU WERE GIVEN: MRSA INFORMATION

## 2012-03-19 NOTE — Telephone Encounter (Signed)
Nurse from [facility] pre op called to report pt's chest x-ray shows small left pleural effusion.  Her PAC removal is scheduled for tomorrow.  Checked with Dr. Luisa Hart who okayed pt for surgery.

## 2012-03-20 ENCOUNTER — Ambulatory Visit (HOSPITAL_COMMUNITY): Payer: Self-pay | Admitting: Anesthesiology

## 2012-03-20 ENCOUNTER — Encounter (HOSPITAL_COMMUNITY)
Admission: RE | Disposition: A | Discharge status: Home / Self Care | Payer: Self-pay | Source: Ambulatory Visit | Attending: General Surgery

## 2012-03-20 ENCOUNTER — Encounter (HOSPITAL_COMMUNITY): Payer: Self-pay

## 2012-03-20 ENCOUNTER — Encounter (HOSPITAL_COMMUNITY): Payer: Self-pay | Admitting: General Surgery

## 2012-03-20 ENCOUNTER — Encounter (HOSPITAL_COMMUNITY): Payer: Self-pay | Admitting: Anesthesiology

## 2012-03-20 ENCOUNTER — Ambulatory Visit (HOSPITAL_COMMUNITY)
Admission: RE | Admit: 2012-03-20 | Discharge: 2012-03-20 | Disposition: A | Discharge status: Home / Self Care | Payer: Self-pay | Source: Ambulatory Visit | Attending: General Surgery | Admitting: General Surgery

## 2012-03-20 DIAGNOSIS — Z9010 Acquired absence of unspecified breast and nipple: Secondary | ICD-10-CM | POA: Insufficient documentation

## 2012-03-20 DIAGNOSIS — Z452 Encounter for adjustment and management of vascular access device: Secondary | ICD-10-CM

## 2012-03-20 DIAGNOSIS — Z853 Personal history of malignant neoplasm of breast: Secondary | ICD-10-CM | POA: Insufficient documentation

## 2012-03-20 DIAGNOSIS — I10 Essential (primary) hypertension: Secondary | ICD-10-CM | POA: Insufficient documentation

## 2012-03-20 HISTORY — PX: PORT-A-CATH REMOVAL: SHX5289

## 2012-03-20 SURGERY — REMOVAL PORT-A-CATH
Anesthesia: Monitor Anesthesia Care | Site: Chest | Wound class: Clean

## 2012-03-20 MED ORDER — 0.9 % SODIUM CHLORIDE (POUR BTL) OPTIME
TOPICAL | Status: DC | PRN
  Administered 2012-03-20: 1000 mL

## 2012-03-20 MED ORDER — BUPIVACAINE-EPINEPHRINE 0.25% -1:200000 IJ SOLN
INTRAMUSCULAR | Status: DC | PRN
  Administered 2012-03-20: 10 mL

## 2012-03-20 MED ORDER — HYDROCODONE-ACETAMINOPHEN 5-325 MG PO TABS: 1.0000 | ORAL_TABLET | Freq: Four times a day (QID) | ORAL | Status: AC | PRN

## 2012-03-20 MED ORDER — HYDROMORPHONE HCL PF 1 MG/ML IJ SOLN: 0.2500 mg | INTRAMUSCULAR | Status: DC | PRN

## 2012-03-20 MED ORDER — PROPOFOL 10 MG/ML IV EMUL
INTRAVENOUS | Status: DC | PRN
  Administered 2012-03-20: 50 ug/kg/min via INTRAVENOUS

## 2012-03-20 MED ORDER — LIDOCAINE HCL 1 % IJ SOLN
INTRAMUSCULAR | Status: AC
  Filled 2012-03-20: qty 20

## 2012-03-20 MED ORDER — CEFAZOLIN SODIUM-DEXTROSE 2-3 GM-% IV SOLR
2.0000 g | INTRAVENOUS | Status: AC
  Administered 2012-03-20: 2 g via INTRAVENOUS

## 2012-03-20 MED ORDER — CEFAZOLIN SODIUM-DEXTROSE 2-3 GM-% IV SOLR
INTRAVENOUS | Status: AC
  Filled 2012-03-20: qty 50

## 2012-03-20 MED ORDER — LIDOCAINE HCL (PF) 1 % IJ SOLN
INTRAMUSCULAR | Status: DC | PRN
  Administered 2012-03-20: 10 mL

## 2012-03-20 MED ORDER — BUPIVACAINE-EPINEPHRINE PF 0.25-1:200000 % IJ SOLN
INTRAMUSCULAR | Status: AC
  Filled 2012-03-20: qty 30

## 2012-03-20 MED ORDER — LACTATED RINGERS IV SOLN
INTRAVENOUS | Status: DC | PRN
  Administered 2012-03-20: 08:00:00 via INTRAVENOUS

## 2012-03-20 MED ORDER — ONDANSETRON HCL 4 MG/2ML IJ SOLN
INTRAMUSCULAR | Status: DC | PRN
  Administered 2012-03-20: 4 mg via INTRAVENOUS

## 2012-03-20 MED ORDER — MIDAZOLAM HCL 5 MG/5ML IJ SOLN
INTRAMUSCULAR | Status: DC | PRN
  Administered 2012-03-20: 2 mg via INTRAVENOUS

## 2012-03-20 SURGICAL SUPPLY — 37 items
ADH SKN CLS APL DERMABOND .7 (GAUZE/BANDAGES/DRESSINGS) ×2
APL SKNCLS STERI-STRIP NONHPOA (GAUZE/BANDAGES/DRESSINGS) ×1
BENZOIN TINCTURE PRP APPL 2/3 (GAUZE/BANDAGES/DRESSINGS) ×2 IMPLANT
BLADE SURG 15 STRL LF DISP TIS (BLADE) ×1 IMPLANT
BLADE SURG 15 STRL SS (BLADE) ×2
CANISTER SUCTION 2500CC (MISCELLANEOUS) ×2 IMPLANT
CHLORAPREP W/TINT 10.5 ML (MISCELLANEOUS) ×2 IMPLANT
CLOTH BEACON ORANGE TIMEOUT ST (SAFETY) ×2 IMPLANT
DECANTER SPIKE VIAL GLASS SM (MISCELLANEOUS) ×2 IMPLANT
DERMABOND ADVANCED (GAUZE/BANDAGES/DRESSINGS) ×2
DERMABOND ADVANCED .7 DNX12 (GAUZE/BANDAGES/DRESSINGS) IMPLANT
DRAPE LAPAROTOMY TRNSV 102X78 (DRAPE) ×1 IMPLANT
ELECT COATED BLADE 2.86 ST (ELECTRODE) ×1 IMPLANT
ELECT REM PT RETURN 9FT ADLT (ELECTROSURGICAL) ×2
ELECTRODE REM PT RTRN 9FT ADLT (ELECTROSURGICAL) ×1 IMPLANT
GAUZE SPONGE 4X4 16PLY XRAY LF (GAUZE/BANDAGES/DRESSINGS) IMPLANT
GLOVE BIO SURGEON STRL SZ 6 (GLOVE) ×2 IMPLANT
GLOVE BIO SURGEON STRL SZ 6.5 (GLOVE) ×1 IMPLANT
GLOVE BIOGEL PI IND STRL 6.5 (GLOVE) ×1 IMPLANT
GLOVE BIOGEL PI IND STRL 7.0 (GLOVE) ×1 IMPLANT
GLOVE BIOGEL PI INDICATOR 6.5 (GLOVE) ×1
GLOVE BIOGEL PI INDICATOR 7.0 (GLOVE) ×1
GLOVE INDICATOR 6.5 STRL GRN (GLOVE) ×4 IMPLANT
GOWN PREVENTION PLUS XXLARGE (GOWN DISPOSABLE) ×2 IMPLANT
KIT BASIN OR (CUSTOM PROCEDURE TRAY) ×2 IMPLANT
NEEDLE HYPO 22GX1.5 SAFETY (NEEDLE) IMPLANT
NEEDLE HYPO 25X1 1.5 SAFETY (NEEDLE) ×2 IMPLANT
PACK BASIC VI WITH GOWN DISP (CUSTOM PROCEDURE TRAY) ×2 IMPLANT
PEN SKIN MARKING BROAD (MISCELLANEOUS) ×2 IMPLANT
PENCIL BUTTON HOLSTER BLD 10FT (ELECTRODE) ×2 IMPLANT
SPONGE GAUZE 4X4 12PLY (GAUZE/BANDAGES/DRESSINGS) ×2 IMPLANT
STRIP CLOSURE SKIN 1/2X4 (GAUZE/BANDAGES/DRESSINGS) ×2 IMPLANT
SUT MNCRL AB 4-0 PS2 18 (SUTURE) ×2 IMPLANT
SUT VIC AB 3-0 SH 27 (SUTURE)
SUT VIC AB 3-0 SH 27X BRD (SUTURE) IMPLANT
SYR CONTROL 10ML LL (SYRINGE) ×2 IMPLANT
TOWEL OR 17X26 10 PK STRL BLUE (TOWEL DISPOSABLE) ×2 IMPLANT

## 2012-03-20 NOTE — Anesthesia Preprocedure Evaluation (Signed)
Anesthesia Evaluation  Patient identified by MRN, date of birth, ID band Patient awake    Reviewed: Allergy & Precautions, H&P , NPO status , Patient's Chart, lab work & pertinent test results, reviewed documented beta blocker date and time   Airway Mallampati: II      Dental  (+) Poor Dentition and Dental Advisory Given   Pulmonary neg pulmonary ROS,  breath sounds clear to auscultation        Cardiovascular hypertension, Pt. on medications Rhythm:Regular Rate:Normal  Denies cardiac symptoms   Neuro/Psych negative neurological ROS  negative psych ROS   GI/Hepatic negative GI ROS, Neg liver ROS,   Endo/Other  negative endocrine ROS  Renal/GU negative Renal ROS  negative genitourinary   Musculoskeletal negative musculoskeletal ROS (+)   Abdominal   Peds negative pediatric ROS (+)  Hematology negative hematology ROS (+)   Anesthesia Other Findings   Reproductive/Obstetrics Hx breast cancer                           Anesthesia Physical Anesthesia Plan  ASA: II  Anesthesia Plan: MAC   Post-op Pain Management:    Induction: Intravenous  Airway Management Planned: Mask  Additional Equipment:   Intra-op Plan:   Post-operative Plan:   Informed Consent: I have reviewed the patients History and Physical, chart, labs and discussed the procedure including the risks, benefits and alternatives for the proposed anesthesia with the patient or authorized representative who has indicated his/her understanding and acceptance.   Dental advisory given  Plan Discussed with: CRNA and Surgeon  Anesthesia Plan Comments:         Anesthesia Quick Evaluation

## 2012-03-20 NOTE — Transfer of Care (Signed)
Immediate Anesthesia Transfer of Care Note  Patient: Cheryl Mendoza  Procedure(s) Performed: Procedure(s) (LRB): REMOVAL PORT-A-CATH (N/A)  Patient Location: PACU  Anesthesia Type: MAC  Level of Consciousness: awake and sedated  Airway & Oxygen Therapy: Patient Spontanous Breathing and Patient connected to face mask oxygen  Post-op Assessment: Report given to PACU RN, Post -op Vital signs reviewed and stable and Patient moving all extremities  Post vital signs: Reviewed and stable  Complications: No apparent anesthesia complications

## 2012-03-20 NOTE — Progress Notes (Signed)
Pt drinking water and eating crackers without difficulty.

## 2012-03-20 NOTE — Interval H&P Note (Signed)
History and Physical Interval Note:  03/20/2012 8:36 AM  Cheryl Mendoza  has presented today for surgery, with the diagnosis of breast cancer  The various methods of treatment have been discussed with the patient and family. After consideration of risks, benefits and other options for treatment, the patient has consented to  Procedure(s) (LRB): REMOVAL PORT-A-CATH (N/A) as a surgical intervention .  The patients' history has been reviewed, patient examined, no change in status, stable for surgery.  I have reviewed the patients' chart and labs.  Questions were answered to the patient's satisfaction.     Gamaliel Charney

## 2012-03-20 NOTE — Discharge Instructions (Signed)
Continue Bactroban ointment x 8 more doses  [facility] Office Phone Number [phone]   POST OP INSTRUCTIONS  Always review your discharge instruction sheet given to you by the facility where your surgery was performed.  IF YOU HAVE DISABILITY OR FAMILY LEAVE FORMS, YOU MUST BRING THEM TO THE OFFICE FOR PROCESSING.  DO NOT GIVE THEM TO YOUR DOCTOR.  1. A prescription for pain medication may be given to you upon discharge.  Take your pain medication as prescribed, if needed.  If narcotic pain medicine is not needed, then you may take acetaminophen (Tylenol) or ibuprofen (Advil) as needed. 2. Take your usually prescribed medications unless otherwise directed 3. If you need a refill on your pain medication, please contact your pharmacy.  They will contact our office to request authorization.  Prescriptions will not be filled after 5pm or on week-ends. 4. You should eat very light the first 24 hours after surgery, such as soup, crackers, pudding, etc.  Resume your normal diet the day after surgery 5. It is common to experience some constipation if taking pain medication after surgery.  Increasing fluid intake and taking a stool softener will usually help or prevent this problem from occurring.  A mild laxative (Milk of Magnesia or Miralax) should be taken according to package directions if there are no bowel movements after 48 hours. 6. You may shower in 48 hours.  The surgical glue will flake off in 2-3 weeks.   7. ACTIVITIES:  No strenuous activity or heavy lifting for 1 week.   a. You may drive when you no longer are taking prescription pain medication, you can comfortably wear a seatbelt, and you can safely maneuver your car and apply brakes. b. RETURN TO WORK:  __________1 week or earlier if no lifting and on no narcotics._______________ Cheryl Mendoza should see your doctor in the office for a follow-up appointment approximately three-four weeks after your surgery.    WHEN TO CALL  YOUR DOCTOR: 1. Fever over 101.0 2. Nausea and/or vomiting. 3. Extreme swelling or bruising. 4. Continued bleeding from incision. 5. Increased pain, redness, or drainage from the incision.  The clinic staff is available to answer your questions during regular business hours.  Please don't hesitate to call and ask to speak to one of the nurses for clinical concerns.  If you have a medical emergency, go to the nearest emergency room or call 911.  A surgeon from [facility] is always on call at the hospital.  For further questions, please visit [email or web address]

## 2012-03-20 NOTE — Anesthesia Postprocedure Evaluation (Signed)
  Anesthesia Post-op Note  Patient: Cheryl Mendoza  Procedure(s) Performed: Procedure(s) (LRB): REMOVAL PORT-A-CATH (N/A)  Patient Location: PACU  Anesthesia Type: MAC  Level of Consciousness: oriented and sedated  Airway and Oxygen Therapy: Patient Spontanous Breathing  Post-op Pain: none  Post-op Assessment: Post-op Vital signs reviewed, Patient's Cardiovascular Status Stable, Respiratory Function Stable and Patent Airway  Post-op Vital Signs: stable  Complications: No apparent anesthesia complications

## 2012-03-20 NOTE — H&P (View-Only) (Signed)
Chief Complaint  Patient presents with  . Pre-op Exam    eval PAC removal    HISTORY: Pt is s/p  Bilateral mastectomies for left breast cancer in 2006 and Right breast cancer in 2007.  She received chemotherapy at that time.  She is planning on returning to [place] where she was born, and would not want to go back with that foreign body in place.  She has not had any trouble with it. She has no evidence of disease now, and is going to be on exemestane long term as long as she can get it.    Past Medical History  Diagnosis Date  . Cancer     Past Surgical History  Procedure Date  . Port a cath insert   Bilateral mastectomies.  Current Outpatient Prescriptions  Medication Sig Dispense Refill  . exemestane (AROMASIN) 25 MG tablet Take 25 mg by mouth daily after breakfast.        . triamterene-hydrochlorothiazide (DYAZIDE) 37.5-25 MG per capsule Take 1 each (1 capsule total) by mouth every morning.  90 capsule  12     No Known Allergies   Family History  Problem Relation Age of Onset  . Cancer Mother     pancreatic  . Cancer Father     prostate     History   Social History  . Marital Status: Married    Spouse Name: N/A    Number of Children: N/A  . Years of Education: N/A   Social History Main Topics  . Smoking status: Never Smoker   . Smokeless tobacco: None  . Alcohol Use: No  . Drug Use: No  . Sexually Active:    Other Topics Concern  . None   Social History Narrative  . None     REVIEW OF SYSTEMS - PERTINENT POSITIVES ONLY: 12 point review of systems negative other than HPI and PMH.  EXAM: Filed Vitals:   03/04/12 0859  BP: 130/84  Pulse: 73  Temp: 97.4 F (36.3 C)    Gen:  No acute distress.  Well nourished and well groomed.   Neurological: Alert and oriented to person, place, and time. Coordination normal.  Head: Normocephalic and atraumatic.  Eyes: Conjunctivae are normal. Pupils are equal, round, and reactive to light. No scleral  icterus.  Neck: Normal range of motion. Neck supple. No tracheal deviation or thyromegaly present.  Cardiovascular: Normal rate, regular rhythm, normal heart sounds and intact distal pulses.  Exam reveals no gallop and no friction rub.  No murmur heard. Respiratory: Effort normal.  No respiratory distress. No chest wall tenderness. Breath sounds normal.  No wheezes, rales or rhonchi.  Breasts:  Surgically absent. GI: Soft. Bowel sounds are normal. The abdomen is soft and nontender.  There is no rebound and no guarding.  Musculoskeletal: Normal range of motion. Extremities are nontender.  Lymphadenopathy: No cervical, preauricular, postauricular or axillary adenopathy is present Skin: Skin is warm and dry. No rash noted. No diaphoresis. No erythema. No pallor. No clubbing, cyanosis, or edema.   Psychiatric: Normal mood and affect. Behavior is normal. Judgment and thought content normal.     ASSESSMENT AND PLAN: Breast cancer Pt is now 6 years out from second breast cancer. Plan removal of port a cath.  Discussed risks, benefits with patient.   Reviewed risk of bleeding, infection, fluid build up. Reviewed post op restrictions of 1 week no strenuous activity or heavy lifting.        Maudry Diego MD  Surgical Oncology, General and Endocrine Surgery [facility]      Visit Diagnoses: 1. Breast cancer     Primary Care Physician: No primary provider on file.

## 2012-03-20 NOTE — Op Note (Signed)
  PRE-OPERATIVE DIAGNOSIS:  un-needed Port-A-Cath for R breast cancer  POST-OPERATIVE DIAGNOSIS:  Same   PROCEDURE:  Procedure(s):  REMOVAL PORT-A-CATH right subclavian vein.   SURGEON:  Surgeon(s):  Almond Lint, MD  ANESTHESIA:   General + local  EBL:   Minimal  SPECIMEN:  None  Complications : none known  Procedure:   Pt was  identified in the holding area and taken to the operating room where she was placed supine on the operating room table.  General anesthesia was induced via LMA.  The R upper chest was prepped and draped.  The prior incision was anesthetized with local anesthetic.  The incision was opened with a #15 blade.  The subcutaneous tissue was divided with the cautery.  The port was identified and the capsule opened.  The silk sutures were removed.  The port was then removed and pressure held on the tract.  The catheter appeared intact without evidence of breakage.  The wound was inspected for hemostasis, which was achieved with cautery.  The wound was closed with 3-0 vicryl deep dermal interrupted sutures and 4-0 Monocryl running subcuticular suture.  The wound was cleaned, dried, and dressed with dermabond.  The patient was awakened from anesthesia and taken to the PACU in stable condition.  Needle, sponge, and instrument counts are correct.

## 2012-03-22 ENCOUNTER — Encounter (HOSPITAL_COMMUNITY): Payer: Self-pay | Admitting: General Surgery

## 2012-04-19 ENCOUNTER — Ambulatory Visit (INDEPENDENT_AMBULATORY_CARE_PROVIDER_SITE_OTHER): Payer: Self-pay | Admitting: General Surgery

## 2012-04-19 ENCOUNTER — Encounter (INDEPENDENT_AMBULATORY_CARE_PROVIDER_SITE_OTHER): Payer: Self-pay | Admitting: General Surgery

## 2012-04-19 VITALS — BP 110/76 | HR 60 | Temp 98.0°F | Resp 14 | Ht 63.0 in | Wt 201.8 lb

## 2012-04-19 DIAGNOSIS — C50919 Malignant neoplasm of unspecified site of unspecified female breast: Secondary | ICD-10-CM

## 2012-04-19 NOTE — Assessment & Plan Note (Signed)
Pt did well after port a cath removal.  Follow up in 1 year.

## 2012-04-19 NOTE — Patient Instructions (Signed)
No restrictions.  Follow up in 1 year.

## 2012-04-19 NOTE — Progress Notes (Signed)
HISTORY: Pt did very well after port a cath removal.  She did not require any narcotic pain medication.  She has returned to her normal activities.  She has no other complaints.      EXAM: General:  Alert and oriented.   Incision:  Soft, no hematoma or infection.  Good range of motion.   ASSESSMENT AND PLAN:   Breast cancer Pt did well after port a cath removal.  Follow up in 1 year.        Maudry Diego, MD Surgical Oncology, General & Endocrine Surgery [facility]  No primary provider on file. No ref. provider found

## 2012-05-20 ENCOUNTER — Other Ambulatory Visit (HOSPITAL_BASED_OUTPATIENT_CLINIC_OR_DEPARTMENT_OTHER): Payer: Self-pay | Admitting: Lab

## 2012-05-20 ENCOUNTER — Ambulatory Visit (HOSPITAL_BASED_OUTPATIENT_CLINIC_OR_DEPARTMENT_OTHER): Payer: Self-pay | Admitting: Oncology

## 2012-05-20 VITALS — BP 123/82 | HR 71 | Temp 98.5°F | Ht 63.0 in | Wt 193.3 lb

## 2012-05-20 DIAGNOSIS — Z853 Personal history of malignant neoplasm of breast: Secondary | ICD-10-CM

## 2012-05-20 DIAGNOSIS — C50319 Malignant neoplasm of lower-inner quadrant of unspecified female breast: Secondary | ICD-10-CM

## 2012-05-20 DIAGNOSIS — C50919 Malignant neoplasm of unspecified site of unspecified female breast: Secondary | ICD-10-CM

## 2012-05-20 LAB — CBC WITH DIFFERENTIAL/PLATELET
BASO%: 0.3 % (ref 0.0–2.0)
Eosinophils Absolute: 0 10*3/uL (ref 0.0–0.5)
HCT: 40.8 % (ref 34.8–46.6)
MCHC: 34.1 g/dL (ref 31.5–36.0)
MONO#: 0.3 10*3/uL (ref 0.1–0.9)
NEUT#: 2.2 10*3/uL (ref 1.5–6.5)
RBC: 4.43 10*6/uL (ref 3.70–5.45)
WBC: 4 10*3/uL (ref 3.9–10.3)
lymph#: 1.4 10*3/uL (ref 0.9–3.3)

## 2012-05-20 NOTE — Progress Notes (Signed)
ID: Cheryl Mendoza   DOB: 1951-10-28  MR#: [id number]  CSN#:[id number]  HISTORY OF PRESENT ILLNESS: The patient tells me she first noted a problem in her left breast in February of 2004 when she was in [place].  She went to the hospital there, and they put her on some pills.  However, the pills did not help.    She came to the [place] in November of 05 and on March 8th, she was scheduled for screening mammography by [facility]. This showed a possible subareolar mass with nipple retraction in the left breast.  On the 16th, additional views were obtained as well as a fuller physical exam. On exam, there was a large, firm mass in the subareolar region and the inner half of the left breast with marked skin thickening peau d'orange and hyperpigmentation.  The breast was very firm and spot compression mammographic views could not be obtained.  Sonography, however, showed diffuse hypoechoic mass throughout the subareolar region in the left lower inner quadrant.  In the left axilla, there was a spiculated mass measuring up to 1.1 cm and also several abnormal lymph nodes, with decrease in the normal fatty hilus and a thickened cortex.  On the same day, the patient had ultrasound-guided biopsies of both the breast mass and the left axillary mass, and this showed ([id number]) a low to intermediate grade breast cancer with no definitive evidence of lymphovascular invasion, but with a positive lymph node biopsy. The tumor cells were ER positive at 18%, PR negative and HER-2/neu negative by FISH.    The patient's subsequent history is summarized below  INTERVAL HISTORY: I thought Charo was planning to move to [place] in earlier, but currently her plan is to move there in November. She tells me she has received a letter from the company telling her they will stopped her exemestane in July. Her sister is feeling of some forms to try to get that extended. Also, she had her port removed since the last  visit here, without event.  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 She also does a little bit of yard work, which she enjoys. Her chest feels tight. This has been present since her surgery. Sometimes with certain motions she can feel a little bit of pain and when she does than his stretching and that relieves it. She has mild headaches, perhaps once a month, for which she takes no medication. Otherwise no cough no shortness of breath no pleurisy no hemoptysis of sometimes her stomach feels "run bleed" and she gets some discomfort, relieved by passing gas. A detailed review of systems was otherwise noncontributory  PAST MEDICAL HISTORY: Past Medical History  Diagnosis Date  . Hypertension   . Cancer     BILATERAL MASTECTOMIES FOR BREAST CANCER / FINISHED CHEMO AND RADIATION --DR. MAGRINOT IS  PT'S ONCOLOGIST.  status post removal of a filarial nodule from the left lower back remotely.    PAST SURGICAL HISTORY: Past Surgical History  Procedure Date  . Port a cath insert   . Breast surgery     2006 LEFT MASTECTOMY  AND 2007 RIGHT MASTECTOMY - FOR TX OF BREAST CANCER-FINISHED CHEMO AND RADIATION--STATES CANCER FREE NOW.  . Tubal ligation     PT STATES SURGERY SO SHE WOULD NOT HAVE ANY MORE CHILDREN  . Port-a-cath removal 03/20/2012    Procedure: REMOVAL PORT-A-CATH;  Surgeon: Almond Lint, MD;  Location: [facility];  Service: General;  Laterality: N/A;    FAMILY HISTORY The patient's father died at the age of 52 with hypertension and diabetes.  The patient's mother died at the age of 39 from cancer of the pancreas. The patient's sister, Comfort, lives in [place].  The patient has a brother, Remi Deter, also in the [place], and two half brothers, Rella Larve and Reuel Boom, also in the [place].    GYNECOLOGIC HISTORY: The patient is G7, P6.  Her six surviving children live in [place]. The youngest is over 44 years old..    SOCIAL HISTORY: The  patient lives with her sister, Comfort, and Comfort's husband. Comfort is a Physicist, medical at [employer] and her husband works in Production designer, theatre/television/film for [employer]. They tell me they have a niece at [employer] studying medicine right now.  The patient is a member of the [employer].   ADVANCED DIRECTIVES:  HEALTH MAINTENANCE: History  Substance Use Topics  . Smoking status: Never Smoker   . Smokeless tobacco: Never Used  . Alcohol Use: No     Colonoscopy:  PAP:  Bone density:  Lipid panel:  No Known Allergies  Current Outpatient Prescriptions  Medication Sig Dispense Refill  . exemestane (AROMASIN) 25 MG tablet Take 25 mg by mouth daily after breakfast.       . triamterene-hydrochlorothiazide (DYAZIDE) 37.5-25 MG per capsule Take 1 each (1 capsule total) by mouth every morning.  90 capsule  12    OBJECTIVE: Middle-aged African woman who appears tired Filed Vitals:   05/20/12 1259  BP: 123/82  Pulse: 71  Temp: 98.5 F (36.9 C)     Body mass index is 34.24 kg/(m^2).    ECOG FS: 0 Sclerae unicteric Oropharynx clear No cervical or supraclavicular l adenopathy Lungs no rales or rhonchi, no dullness to percussion bilaterally Heart regular rate and rhythm Abd soft, nontender, bowel sounds positive, not increased. No organomegaly or masses MSK no focal spinal tenderness, no peripheral edema Neuro: nonfocal Breasts: Status post bilateral mastectomies. No evidence of chest wall recurrence. Both axillae are clear to palpation  LAB RESULTS: Lab Results  Component Value Date   WBC 4.0 05/20/2012   NEUTROABS 2.2 05/20/2012   HGB 13.9 05/20/2012   HCT 40.8 05/20/2012   MCV 92.2 05/20/2012   PLT 209 05/20/2012      Chemistry      Component Value Date/Time   NA 141 03/19/2012 1100   K 3.3* 03/19/2012 1100   CL 100 03/19/2012 1100   CO2 31 03/19/2012 1100   BUN 13 03/19/2012 1100   CREATININE 1.05 03/19/2012 1100      Component Value Date/Time   CALCIUM 9.7 03/19/2012 1100    ALKPHOS 63 01/15/2012 1232   AST 16 01/15/2012 1232   ALT 17 01/15/2012 1232   BILITOT 0.3 01/15/2012 1232       Lab Results  Component Value Date   LABCA2 40* 01/15/2012    No results found for this basename: INR:1;PROTIME:1 in the last 168 hours  No results found for this basename: UACOL:1,UAPR:1,USPG:1,UPH:1,UTP:1,UGL:1,UKET:1,UBIL:1,UHGB:1,UNIT:1,UROB:1,ULEU:1,UEPI:1,UWBC:1,URBC:1,UBAC:1,CAST:1,CRYS:1,UCOM:1,BILUA:1 in the last 72 hours   STUDIES: No results found.  ASSESSMENT: 61 y.o.  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  Treated neoadjuvantly with doxorubicin and cyclophosphamide x4 given in dose-dense fashion and followed by paclitaxel x6.  Received post mastectomy radiation and received trastuzumab from August 2007 until April 2008.  Tumor was HER-2/neu  equivocal.  Also started on tamoxifen after the completion of radiation therapy.  (2) Right breast carcinoma initially biopsied in June 2007, status post right modified radical mastectomy in July 2007 for a 7-mm invasive ductal carcinoma, grade 1, with 15 of 27 lymph nodes involved.  Tumor was ER positive, weakly PR positive, HER-2/neu equivocal with low proliferation fraction.  Status post radiation including pelvic radiation to induce ovarian castration, then started on exemestane in October 2007 on which she continues.   PLAN: Elivia is planning to return to [place] in November. We're going to see her one more time in October, just to make sure things continue well. She will have lab work and a chest x-ray prior to that visit.  Her sisters filling out some papers so Raelyn can continue to receive her exemestane. I n we do have financial counselors who can be of help in that regard if necessary.  Otherwise she seems to be doing remarkably well. She knows to call for any problems that may  develop before the next visit.   Deuce Paternoster C    05/20/2012

## 2012-05-21 ENCOUNTER — Encounter: Payer: Self-pay | Admitting: Oncology

## 2012-05-21 LAB — COMPREHENSIVE METABOLIC PANEL
ALT: 14 U/L (ref 0–35)
Albumin: 4.1 g/dL (ref 3.5–5.2)
CO2: 32 mEq/L (ref 19–32)
Calcium: 9.4 mg/dL (ref 8.4–10.5)
Chloride: 101 mEq/L (ref 96–112)
Glucose, Bld: 79 mg/dL (ref 70–99)
Potassium: 3.7 mEq/L (ref 3.5–5.3)
Sodium: 143 mEq/L (ref 135–145)
Total Protein: 7.1 g/dL (ref 6.0–8.3)

## 2012-05-21 LAB — CANCER ANTIGEN 27.29: CA 27.29: 45 U/mL — ABNORMAL HIGH (ref 0–39)

## 2012-05-21 NOTE — Progress Notes (Signed)
Received a new application from [employer] for  Cheryl Mendoza patient assistance. Filled out MD's portion and mailed the application to patient's sister, Comfort, to complete and mail back to [employer] with proof of income.

## 2012-08-12 ENCOUNTER — Ambulatory Visit (HOSPITAL_BASED_OUTPATIENT_CLINIC_OR_DEPARTMENT_OTHER): Payer: Self-pay | Admitting: Physician Assistant

## 2012-08-12 ENCOUNTER — Encounter: Payer: Self-pay | Admitting: Physician Assistant

## 2012-08-12 ENCOUNTER — Other Ambulatory Visit (HOSPITAL_BASED_OUTPATIENT_CLINIC_OR_DEPARTMENT_OTHER): Payer: Self-pay | Admitting: Lab

## 2012-08-12 ENCOUNTER — Ambulatory Visit (HOSPITAL_COMMUNITY)
Admission: RE | Admit: 2012-08-12 | Discharge: 2012-08-12 | Disposition: A | Discharge status: Home / Self Care | Payer: Self-pay | Source: Ambulatory Visit | Attending: Physician Assistant | Admitting: Physician Assistant

## 2012-08-12 ENCOUNTER — Ambulatory Visit (HOSPITAL_COMMUNITY)
Admission: RE | Admit: 2012-08-12 | Discharge: 2012-08-12 | Disposition: A | Discharge status: Home / Self Care | Payer: Self-pay | Source: Ambulatory Visit | Attending: Oncology | Admitting: Oncology

## 2012-08-12 ENCOUNTER — Telehealth: Payer: Self-pay | Admitting: Oncology

## 2012-08-12 VITALS — BP 155/100 | HR 73 | Temp 98.1°F | Resp 20 | Ht 63.0 in | Wt 189.7 lb

## 2012-08-12 DIAGNOSIS — C50419 Malignant neoplasm of upper-outer quadrant of unspecified female breast: Secondary | ICD-10-CM

## 2012-08-12 DIAGNOSIS — J90 Pleural effusion, not elsewhere classified: Secondary | ICD-10-CM

## 2012-08-12 DIAGNOSIS — C50919 Malignant neoplasm of unspecified site of unspecified female breast: Secondary | ICD-10-CM

## 2012-08-12 DIAGNOSIS — C50019 Malignant neoplasm of nipple and areola, unspecified female breast: Secondary | ICD-10-CM

## 2012-08-12 LAB — CBC WITH DIFFERENTIAL/PLATELET
BASO%: 0.3 % (ref 0.0–2.0)
EOS%: 1.1 % (ref 0.0–7.0)
HCT: 42.2 % (ref 34.8–46.6)
MCH: 31.5 pg (ref 25.1–34.0)
MCHC: 34.2 g/dL (ref 31.5–36.0)
NEUT%: 47.5 % (ref 38.4–76.8)
RBC: 4.59 10*6/uL (ref 3.70–5.45)
RDW: 13.5 % (ref 11.2–14.5)
lymph#: 1.5 10*3/uL (ref 0.9–3.3)

## 2012-08-12 LAB — COMPREHENSIVE METABOLIC PANEL (CC13)
ALT: 13 U/L (ref 0–55)
AST: 15 U/L (ref 5–34)
Calcium: 9.8 mg/dL (ref 8.4–10.4)
Chloride: 104 mEq/L (ref 98–107)
Creatinine: 1 mg/dL (ref 0.6–1.1)
Sodium: 141 mEq/L (ref 136–145)

## 2012-08-12 NOTE — Progress Notes (Signed)
ID: Cheryl Mendoza   DOB: [birth year]  MR#: [id number]  CSN#:[id number]  HISTORY OF PRESENT ILLNESS: The patient tells me she first noted a problem in her left breast in February of 2004 when she was in [place].  She went to the hospital there, and they put her on some pills.  However, the pills did not help.    She came to the [place] in November of 05 and on March 8th, she was scheduled for screening mammography by [facility]. This showed a possible subareolar mass with nipple retraction in the left breast.  On the 16th, additional views were obtained as well as a fuller physical exam. On exam, there was a large, firm mass in the subareolar region and the inner half of the left breast with marked skin thickening peau d'orange and hyperpigmentation.  The breast was very firm and spot compression mammographic views could not be obtained.  Sonography, however, showed diffuse hypoechoic mass throughout the subareolar region in the left lower inner quadrant.  In the left axilla, there was a spiculated mass measuring up to 1.1 cm and also several abnormal lymph nodes, with decrease in the normal fatty hilus and a thickened cortex.  On the same day, the patient had ultrasound-guided biopsies of both the breast mass and the left axillary mass, and this showed ([id number]) a low to intermediate grade breast cancer with no definitive evidence of lymphovascular invasion, but with a positive lymph node biopsy. The tumor cells were ER positive at 18%, PR negative and HER-2/neu negative by FISH.    The patient's subsequent history is summarized below  INTERVAL HISTORY: Cheryl Mendoza returns today for followup of her bilateral breast carcinomas. She continues on exemestane which she tolerates very well. She is planning to move back to [place] in November, and is anxious to see her family there. She has not seen her husband, children, or grandchildren in 8 years.   REVIEW OF SYSTEMS: Cheryl Mendoza is feeling well  overall. She has had no recent fevers, chills, or increased hot flashes. No skin changes or abnormal bleeding has been noted. She is eating and drinking well with no nausea or change in bowel or bladder habits. She has no significant cough and no phlegm production. The only shortness of breath she has noticed is when she lies down, at which time there is a small amount of orthopnea. No PND. She denies chest pain or palpitations. She has had no peripheral swelling. No abnormal headaches or dizziness. No unusual myalgias or arthralgias.  In fact a detailed review of systems is otherwise noncontributory.   PAST MEDICAL HISTORY: Past Medical History  Diagnosis Date  . Hypertension   . Cancer     BILATERAL MASTECTOMIES FOR BREAST CANCER / FINISHED CHEMO AND RADIATION --DR. MAGRINOT IS  PT'S ONCOLOGIST.  status post removal of a filarial nodule from the left lower back remotely.    PAST SURGICAL HISTORY: Past Surgical History  Procedure Date  . Port a cath insert   . Breast surgery     2006 LEFT MASTECTOMY  AND 2007 RIGHT MASTECTOMY - FOR TX OF BREAST CANCER-FINISHED CHEMO AND RADIATION--STATES CANCER FREE NOW.  . Tubal ligation     PT STATES SURGERY SO SHE WOULD NOT HAVE ANY MORE CHILDREN  . Port-a-cath removal 03/20/2012    Procedure: REMOVAL PORT-A-CATH;  Surgeon: Almond Lint, MD;  Location: [facility];  Service: General;  Laterality: N/A;    FAMILY HISTORY The patient's father died at the age of  72 with hypertension and diabetes.  The patient's mother died at the age of 27 from cancer of the pancreas. The patient's sister, Cheryl Mendoza, lives in [place].  The patient has a brother, Cheryl Mendoza, also in the [place], and two half brothers, Cheryl Mendoza and Cheryl Mendoza, also in the [place].    GYNECOLOGIC HISTORY: The patient is G7, P6.  Her six surviving children live in [place]. The youngest is over 52 years old..    SOCIAL HISTORY: The patient lives with her sister, Cheryl Mendoza, and Cheryl Mendoza's  husband. 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 They tell me they have a niece at [employer] studying medicine right now.  The patient is a member of the [employer].   ADVANCED DIRECTIVES:  HEALTH MAINTENANCE: History  Substance Use Topics  . Smoking status: Never Smoker   . Smokeless tobacco: Never Used  . Alcohol Use: No     Colonoscopy:  PAP:  Bone density:  Lipid panel:  No Known Allergies  Current Outpatient Prescriptions  Medication Sig Dispense Refill  . aspirin 81 MG tablet Take 81 mg by mouth daily.      [name] exemestane (AROMASIN) 25 MG tablet Take 25 mg by mouth daily after breakfast.       . triamterene-hydrochlorothiazide (DYAZIDE) 37.5-25 MG per capsule Take 1 each (1 capsule total) by mouth every morning.  90 capsule  12    OBJECTIVE: Middle-aged African woman who appears comfortable and in no acute distress Filed Vitals:   08/12/12 0946  BP: 155/100  Pulse: 73  Temp: 98.1 F (36.7 C)  Resp: 20     Body mass index is 33.60 kg/(m^2).    ECOG FS: 0 Filed Weights   08/12/12 0946  Weight: 189 lb 11.2 oz (86.047 kg)   Sclerae unicteric Oropharynx clear No cervical or supraclavicular adenopathy Lungs clear to auscultation on the right; diminished breath sounds in the base of the left lung, with dullness to percussion on the left Heart regular rate and rhythm Abd soft, nontender, bowel sounds positive. MSK no focal spinal tenderness, no peripheral edema Neuro: nonfocal, alert and oriented x3 Breasts: Status post bilateral mastectomies. No evidence of chest wall recurrence. Both axillae are clear to palpation  LAB RESULTS: Lab Results  Component Value Date   WBC 3.5* 08/12/2012   NEUTROABS 1.7 08/12/2012   HGB 14.4 08/12/2012   HCT 42.2 08/12/2012   MCV 92.0 08/12/2012   PLT 241 08/12/2012      Chemistry      Component Value Date/Time   NA 141 08/12/2012 0822   NA 143 05/20/2012 1239   K 3.8  08/12/2012 0822   K 3.7 05/20/2012 1239   CL 104 08/12/2012 0822   CL 101 05/20/2012 1239   CO2 24 08/12/2012 0822   CO2 32 05/20/2012 1239   BUN 16.0 08/12/2012 0822   BUN 13 05/20/2012 1239   CREATININE 1.0 08/12/2012 0822   CREATININE 0.99 05/20/2012 1239      Component Value Date/Time   CALCIUM 9.8 08/12/2012 0822   CALCIUM 9.4 05/20/2012 1239   ALKPHOS 70 08/12/2012 0822   ALKPHOS 67 05/20/2012 1239   AST 15 08/12/2012 0822   AST 18 05/20/2012 1239   ALT 13 08/12/2012 0822   ALT 14 05/20/2012 1239   BILITOT 0.50 08/12/2012 0822   BILITOT 0.7 05/20/2012 1239       Lab Results  Component Value Date   LABCA2 45*  05/20/2012     STUDIES:  Dg Chest 2 View  08/12/2012  *RADIOLOGY REPORT*  Clinical Data: Follow up breast cancer  CHEST - 2 VIEW  Comparison: 03/19/2012  Findings: Large left pleural effusion, increased.  Underlying compressive atelectasis.  Right lung is essentially clear.  No pneumothorax.  The heart is normal in size.  Surgical clips along the right chest wall.  Interval removal of prior right chest port.  IMPRESSION: Large left pleural effusion, increased.   Original Report Authenticated By: Charline Bills, M.D.      ASSESSMENT: 61 y.o.  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  Treated neoadjuvantly with doxorubicin and cyclophosphamide x4 given in dose-dense fashion and followed by paclitaxel x6.  Received post mastectomy radiation and received trastuzumab from August 2007 until April 2008.  Tumor was HER-2/neu equivocal.  Also started on tamoxifen after the completion of radiation therapy.  (2) Right breast carcinoma initially biopsied in June 2007, status post right modified radical mastectomy in July 2007 for a 7-mm invasive ductal carcinoma, grade 1, with 15 of 27 lymph nodes involved.  Tumor was ER positive, weakly PR positive, HER-2/neu  equivocal with low proliferation fraction.  Status post radiation including pelvic radiation to induce ovarian castration, then started on exemestane in October 2007 on which she continues.   PLAN:  I have reviewed this case, including Kenlynn's chest x-ray, with Dr. Darnelle Catalan. He has suggested proceeding with a left diagnostic thoracentesis with cytology, and fortunately we were able to get that scheduled for her today. We will see her back in one week on October 14 to review those results.   In the meanwhile she continues on exemestane which she is tolerating well.  She knows to call for any problems that may develop before the next visit.   Tasean Mancha    08/12/2012

## 2012-08-12 NOTE — Procedures (Signed)
Hx of breast cancer with left pleural effusion  Procedure : left thoracentesis Specimen. 1.2L amber colored fluid Complications : none immediate  Fluid sent to lab as requested

## 2012-08-12 NOTE — Telephone Encounter (Signed)
gve the pt her oct 2013 appt calendar °

## 2012-08-13 ENCOUNTER — Other Ambulatory Visit (HOSPITAL_COMMUNITY): Payer: Self-pay

## 2012-08-19 ENCOUNTER — Telehealth: Payer: Self-pay | Admitting: Oncology

## 2012-08-19 ENCOUNTER — Ambulatory Visit (HOSPITAL_BASED_OUTPATIENT_CLINIC_OR_DEPARTMENT_OTHER): Payer: Self-pay | Admitting: Oncology

## 2012-08-19 ENCOUNTER — Ambulatory Visit: Payer: Self-pay | Admitting: Oncology

## 2012-08-19 VITALS — BP 149/95 | HR 80 | Temp 98.0°F | Resp 20 | Ht 63.0 in | Wt 188.0 lb

## 2012-08-19 DIAGNOSIS — Z853 Personal history of malignant neoplasm of breast: Secondary | ICD-10-CM

## 2012-08-19 DIAGNOSIS — C50419 Malignant neoplasm of upper-outer quadrant of unspecified female breast: Secondary | ICD-10-CM

## 2012-08-19 DIAGNOSIS — Z5111 Encounter for antineoplastic chemotherapy: Secondary | ICD-10-CM

## 2012-08-19 DIAGNOSIS — C50919 Malignant neoplasm of unspecified site of unspecified female breast: Secondary | ICD-10-CM

## 2012-08-19 DIAGNOSIS — J90 Pleural effusion, not elsewhere classified: Secondary | ICD-10-CM

## 2012-08-19 MED ORDER — FULVESTRANT 250 MG/5ML IM SOLN
500.0000 mg | INTRAMUSCULAR | Status: DC
  Administered 2012-08-19: 500 mg via INTRAMUSCULAR
  Filled 2012-08-19: qty 10

## 2012-08-19 NOTE — Telephone Encounter (Signed)
gve the pt her oct-dec 2013 appt calendar along with the ct scan appt with instructions °

## 2012-08-19 NOTE — Progress Notes (Signed)
ID: Cheryl Mendoza   DOB: 11/25/1950  MR#: [id number]  GEX#:[id number]  HISTORY OF PRESENT ILLNESS: The patient tells me she first noted a problem in her left breast in February of 2004 when she was in [place].  She went to the hospital there, and they put her on some pills.  However, the pills did not help.    She came to the [place] in November of 05 and on March 8th, she was scheduled for screening mammography by [facility]. This showed a possible subareolar mass with nipple retraction in the left breast.  On the 16th, additional views were obtained as well as a fuller physical exam. On exam, there was a large, firm mass in the subareolar region and the inner half of the left breast with marked skin thickening peau d'orange and hyperpigmentation.  The breast was very firm and spot compression mammographic views could not be obtained.  Sonography, however, showed diffuse hypoechoic mass throughout the subareolar region in the left lower inner quadrant.  In the left axilla, there was a spiculated mass measuring up to 1.1 cm and also several abnormal lymph nodes, with decrease in the normal fatty hilus and a thickened cortex.  On the same day, the patient had ultrasound-guided biopsies of both the breast mass and the left axillary mass, and this showed ([id number]) a low to intermediate grade breast cancer with no definitive evidence of lymphovascular invasion, but with a positive lymph node biopsy. The tumor cells were ER positive at 18%, PR negative and HER-2/neu negative by FISH.    The patient's subsequent history is summarized below  INTERVAL HISTORY: [name] returns today with her sister for followup of her bilateral breast carcinomas. At the last visit here she complained of some tightness in her chest, and a chest x-ray showed a large left pleural effusion. She was set up for thoracentesis, and unfortunately the pleural fluid has confirmed of cancer cells, which fortunately are still  estrogen receptor positive.[name]   REVIEW OF SYSTEMS: She is very anxious and somewhat depressed, but her breathing is a little bit better. She denies any cough, phlegm production, pleurisy, or worsening shortness of breath. The sensation of tightness in her chest was relieved with the thoracentesis. Otherwise a detailed review of systems today was significant for some left arm discomfort, loss of sleep, and some back pain.   PAST MEDICAL HISTORY: Past Medical History  Diagnosis Date  . Hypertension   . Cancer     BILATERAL MASTECTOMIES FOR BREAST CANCER / FINISHED CHEMO AND RADIATION --DR. MAGRINOT IS  PT'S ONCOLOGIST.  status post removal of a filarial nodule from the left lower back remotely.    PAST SURGICAL HISTORY: Past Surgical History  Procedure Date  . Port a cath insert   . Breast surgery     2006 LEFT MASTECTOMY  AND 2007 RIGHT MASTECTOMY - FOR TX OF BREAST CANCER-FINISHED CHEMO AND RADIATION--STATES CANCER FREE NOW.  . Tubal ligation     PT STATES SURGERY SO SHE WOULD NOT HAVE ANY MORE CHILDREN  . Port-a-cath removal 03/20/2012    Procedure: REMOVAL PORT-A-CATH;  Surgeon: Almond Lint, MD;  Location: [facility];  Service: General;  Laterality: N/A;    FAMILY HISTORY The patient's father died at the age of 48 with hypertension and diabetes.  The patient's mother died at the age of 59 from cancer of the pancreas. The patient's sister, Comfort, lives in [place].  The patient has a brother, Remi Deter, also in the [place], and two half brothers, Rella Larve and Reuel Boom, also in the [place].    GYNECOLOGIC HISTORY: The patient is G7, P6.  Her six surviving children live in [place]. The youngest is over 55 years old..    SOCIAL HISTORY: The patient lives with her sister, Comfort, and Comfort's husband. Comfort is a Physicist, medical at [employer] and her husband works in Production designer, theatre/television/film for [employer]. They tell me they have a niece at [employer] studying medicine right now.  The  patient is a member of the [employer].   ADVANCED DIRECTIVES:  HEALTH MAINTENANCE: History  Substance Use Topics  . Smoking status: Never Smoker   . Smokeless tobacco: Never Used  . Alcohol Use: No     Colonoscopy:  PAP:  Bone density:  Lipid panel:  No Known Allergies  Current Outpatient Prescriptions  Medication Sig Dispense Refill  . aspirin 81 MG tablet Take 81 mg by mouth daily.      [name] exemestane (AROMASIN) 25 MG tablet Take 25 mg by mouth daily after breakfast.       . triamterene-hydrochlorothiazide (DYAZIDE) 37.5-25 MG per capsule Take 1 each (1 capsule total) by mouth every morning.  90 capsule  12    OBJECTIVE: Middle-aged African woman who appears comfortable and in no acute distress Filed Vitals:   08/19/12 0940  BP: 149/95  Pulse: 80  Temp: 98 F (36.7 C)  Resp: 20     Body mass index is 33.30 kg/(m^2).    ECOG FS: 0 Filed Weights   08/19/12 0940  Weight: 188 lb (85.276 kg)   Sclerae unicteric Oropharynx clear No cervical or supraclavicular adenopathy Lungs clear to auscultation on the right; diminished breath sounds in the base of the left lung, with dullness to percussion on the left Heart regular rate and rhythm Abd soft, nontender, bowel sounds positive. MSK no focal spinal tenderness, no peripheral edema Neuro: nonfocal, alert and oriented x3 Breasts: Status post bilateral mastectomies. No evidence of chest wall recurrence. Both axillae are clear to palpation  LAB RESULTS: Lab Results  Component Value Date   WBC 3.5* 08/12/2012   NEUTROABS 1.7 08/12/2012   HGB 14.4 08/12/2012   HCT 42.2 08/12/2012   MCV 92.0 08/12/2012   PLT 241 08/12/2012      Chemistry      Component Value Date/Time   NA 141 08/12/2012 0822   NA 143 05/20/2012 1239   K 3.8 08/12/2012 0822   K 3.7 05/20/2012 1239   CL 104 08/12/2012 0822   CL 101 05/20/2012 1239   CO2 24 08/12/2012 0822   CO2 32 05/20/2012 1239   BUN 16.0 08/12/2012 0822   BUN 13 05/20/2012 1239    CREATININE 1.0 08/12/2012 0822   CREATININE 0.99 05/20/2012 1239      Component Value Date/Time   CALCIUM 9.8 08/12/2012 0822   CALCIUM 9.4 05/20/2012 1239   ALKPHOS 70 08/12/2012 0822   ALKPHOS 67 05/20/2012 1239   AST 15 08/12/2012 0822   AST 18 05/20/2012 1239   ALT 13 08/12/2012 0822   ALT 14 05/20/2012 1239   BILITOT 0.50 08/12/2012 0822   BILITOT 0.7 05/20/2012 1239       Lab Results  Component Value Date   LABCA2 45* 05/20/2012     STUDIES:  Dg Chest 2 View  08/12/2012  *RADIOLOGY REPORT*  Clinical Data: Follow up breast cancer  CHEST - 2 VIEW  Comparison: 03/19/2012  Findings: Large left pleural effusion, increased.  Underlying  compressive atelectasis.  Right lung is essentially clear.  No pneumothorax.  The heart is normal in size.  Surgical clips along the right chest wall.  Interval removal of prior right chest port.  IMPRESSION: Large left pleural effusion, increased.   Original Report Authenticated By: Charline Bills, M.D.      ASSESSMENT: 61 y.o.  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  Treated neoadjuvantly with doxorubicin and cyclophosphamide x4 given in dose-dense fashion and followed by paclitaxel x6.  Received post mastectomy radiation and received trastuzumab from August 2007 until April 2008.  Tumor was HER-2/neu equivocal.  Also started on tamoxifen after the completion of radiation therapy.  (2) Right breast carcinoma initially biopsied in June 2007, status post right modified radical mastectomy in July 2007 for a 7-mm invasive ductal carcinoma, grade 1, with 15 of 27 lymph nodes involved.  Tumor was ER positive, weakly PR positive, HER-2/neu equivocal with low proliferation fraction.  Status post radiation including pelvic radiation to induce ovarian castration, then started on exemestane in October 2007  (3) malignant pleural  effusion cytologically documented October 2013, the cells still being estrogen receptor positive.  (4) fulvestrant started 08/19/2012  PLAN:  I have discussed the situation with Ardyn. This is very unfortunate, because she was hoping to get to [place] in December. I think we can get a diffuse doses of fulvestrant and before her trip, and then a she can possibly continue that and [place] of that might be a possible solution. Otherwise she may have to try to come back in to the [place] to get further treatment.  I don't know whether she will need pleurodesed this, but this seems likely. I am going to obtain CTs of the chest abdomen and pelvis to completely restage her at this point, and she will return to see [place] in 2 weeks. She knows to call for any problems that may develop before then.  Lelynd Poer C    08/19/2012

## 2012-08-21 ENCOUNTER — Encounter: Payer: Self-pay | Admitting: Oncology

## 2012-08-21 NOTE — Progress Notes (Signed)
Landscape architect to [employer]fort, Cathey's sister, for free faslodex. I filled out our part so she only needs to sign, attach proof of income and mail it to the company.

## 2012-08-28 ENCOUNTER — Encounter: Payer: Self-pay | Admitting: Oncology

## 2012-08-28 ENCOUNTER — Ambulatory Visit (HOSPITAL_COMMUNITY)
Admission: RE | Admit: 2012-08-28 | Discharge: 2012-08-28 | Disposition: A | Discharge status: Home / Self Care | Payer: Self-pay | Source: Ambulatory Visit | Attending: Oncology | Admitting: Oncology

## 2012-08-28 DIAGNOSIS — R918 Other nonspecific abnormal finding of lung field: Secondary | ICD-10-CM | POA: Insufficient documentation

## 2012-08-28 DIAGNOSIS — J90 Pleural effusion, not elsewhere classified: Secondary | ICD-10-CM | POA: Insufficient documentation

## 2012-08-28 DIAGNOSIS — C50919 Malignant neoplasm of unspecified site of unspecified female breast: Secondary | ICD-10-CM | POA: Insufficient documentation

## 2012-08-28 DIAGNOSIS — Z09 Encounter for follow-up examination after completed treatment for conditions other than malignant neoplasm: Secondary | ICD-10-CM | POA: Insufficient documentation

## 2012-08-28 MED ORDER — IOHEXOL 300 MG/ML  SOLN
100.0000 mL | Freq: Once | INTRAMUSCULAR | Status: AC | PRN
  Administered 2012-08-28: 100 mL via INTRAVENOUS

## 2012-08-28 NOTE — Progress Notes (Signed)
Faxed faslodex application to [employer].

## 2012-08-31 ENCOUNTER — Encounter: Payer: Self-pay | Admitting: Oncology

## 2012-09-02 ENCOUNTER — Other Ambulatory Visit (HOSPITAL_BASED_OUTPATIENT_CLINIC_OR_DEPARTMENT_OTHER): Payer: Self-pay | Admitting: Lab

## 2012-09-02 ENCOUNTER — Ambulatory Visit (HOSPITAL_BASED_OUTPATIENT_CLINIC_OR_DEPARTMENT_OTHER): Payer: Self-pay | Admitting: Physician Assistant

## 2012-09-02 ENCOUNTER — Encounter: Payer: Self-pay | Admitting: Physician Assistant

## 2012-09-02 VITALS — BP 134/88 | HR 70 | Temp 98.7°F | Resp 20 | Ht 63.0 in | Wt 185.5 lb

## 2012-09-02 DIAGNOSIS — C50919 Malignant neoplasm of unspecified site of unspecified female breast: Secondary | ICD-10-CM

## 2012-09-02 DIAGNOSIS — D709 Neutropenia, unspecified: Secondary | ICD-10-CM

## 2012-09-02 DIAGNOSIS — C50019 Malignant neoplasm of nipple and areola, unspecified female breast: Secondary | ICD-10-CM

## 2012-09-02 DIAGNOSIS — Z5111 Encounter for antineoplastic chemotherapy: Secondary | ICD-10-CM

## 2012-09-02 DIAGNOSIS — C773 Secondary and unspecified malignant neoplasm of axilla and upper limb lymph nodes: Secondary | ICD-10-CM

## 2012-09-02 DIAGNOSIS — C50219 Malignant neoplasm of upper-inner quadrant of unspecified female breast: Secondary | ICD-10-CM

## 2012-09-02 LAB — CBC WITH DIFFERENTIAL/PLATELET
BASO%: 0.2 % (ref 0.0–2.0)
EOS%: 3.8 % (ref 0.0–7.0)
HCT: 40.5 % (ref 34.8–46.6)
LYMPH%: 57.4 % — ABNORMAL HIGH (ref 14.0–49.7)
MCH: 31.2 pg (ref 25.1–34.0)
MCHC: 34.5 g/dL (ref 31.5–36.0)
MCV: 90.3 fL (ref 79.5–101.0)
MONO%: 9.2 % (ref 0.0–14.0)
NEUT%: 29.4 % — ABNORMAL LOW (ref 38.4–76.8)
Platelets: 200 10*3/uL (ref 145–400)
RBC: 4.49 10*6/uL (ref 3.70–5.45)

## 2012-09-02 LAB — COMPREHENSIVE METABOLIC PANEL (CC13)
ALT: 19 U/L (ref 0–55)
AST: 16 U/L (ref 5–34)
Alkaline Phosphatase: 67 U/L (ref 40–150)
CO2: 27 mEq/L (ref 22–29)
Creatinine: 0.9 mg/dL (ref 0.6–1.1)
Total Bilirubin: 0.3 mg/dL (ref 0.20–1.20)

## 2012-09-02 LAB — CANCER ANTIGEN 27.29: CA 27.29: 39 U/mL (ref 0–39)

## 2012-09-02 MED ORDER — CIPROFLOXACIN HCL 500 MG PO TABS: 500.0000 mg | ORAL_TABLET | Freq: Two times a day (BID) | ORAL | Status: DC

## 2012-09-02 MED ORDER — FULVESTRANT 250 MG/5ML IM SOLN
500.0000 mg | INTRAMUSCULAR | Status: DC
  Administered 2012-09-02: 500 mg via INTRAMUSCULAR
  Filled 2012-09-02: qty 10

## 2012-09-02 NOTE — Progress Notes (Signed)
ID: Cheryl Mendoza   DOB: [birth year]  MR#: [id number]  CSN#:[id number]  HISTORY OF PRESENT ILLNESS: The patient tells me she first noted a problem in her left breast in February of 2004 when she was in [place].  She went to the hospital there, and they put her on some pills.  However, the pills did not help.    She came to the [place] in November of 05 and on March 8th, she was scheduled for screening mammography by [facility]. This showed a possible subareolar mass with nipple retraction in the left breast.  On the 16th, additional views were obtained as well as a fuller physical exam. On exam, there was a large, firm mass in the subareolar region and the inner half of the left breast with marked skin thickening peau d'orange and hyperpigmentation.  The breast was very firm and spot compression mammographic views could not be obtained.  Sonography, however, showed diffuse hypoechoic mass throughout the subareolar region in the left lower inner quadrant.  In the left axilla, there was a spiculated mass measuring up to 1.1 cm and also several abnormal lymph nodes, with decrease in the normal fatty hilus and a thickened cortex.  On the same day, the patient had ultrasound-guided biopsies of both the breast mass and the left axillary mass, and this showed ([id number]) a low to intermediate grade breast cancer with no definitive evidence of lymphovascular invasion, but with a positive lymph node biopsy. The tumor cells were ER positive at 18%, PR negative and HER-2/neu negative by FISH.    The patient's subsequent history is summarized below  INTERVAL HISTORY: Cheryl Mendoza returns today with her sister for followup of her bilateral breast carcinoma, now with recurrence as noted with a malignant pleural effusion on the left. She's currently receiving fulvestrant, received her first injection 2 weeks ago, and is due for her next injections today.  Cheryl Mendoza is very upbeat, less anxious than before,  and physically is feeling well. She has some mild shortness of breath with occasional cough productive of clear phlegm. She has had no hemoptysis. No significant increase in shortness of breath. No fevers, or chills.    REVIEW OF SYSTEMS: Cheryl Mendoza denies any rashes or skin changes. She's had no signs of abnormal bleeding. She denies any hot flashes. She has some pain occasionally in the left arm, but denies any additional myalgias or arthralgias. She has had no chest pain or palpitations. She denies any abnormal headaches or dizziness.  A detailed review of systems is otherwise stable and noncontributory.   PAST MEDICAL HISTORY: Past Medical History  Diagnosis Date  . Hypertension   . Cancer     BILATERAL MASTECTOMIES FOR BREAST CANCER / FINISHED CHEMO AND RADIATION --DR. MAGRINOT IS  PT'S ONCOLOGIST.  status post removal of a filarial nodule from the left lower back remotely.    PAST SURGICAL HISTORY: Past Surgical History  Procedure Date  . Port a cath insert   . Breast surgery     2006 LEFT MASTECTOMY  AND 2007 RIGHT MASTECTOMY - FOR TX OF BREAST CANCER-FINISHED CHEMO AND RADIATION--STATES CANCER FREE NOW.  . Tubal ligation     PT STATES SURGERY SO SHE WOULD NOT HAVE ANY MORE CHILDREN  . Port-a-cath removal 03/20/2012    Procedure: REMOVAL PORT-A-CATH;  Surgeon: Almond Lint, MD;  Location: [facility];  Service: General;  Laterality: N/A;    FAMILY HISTORY The patient's father died at the age of 27 with hypertension and diabetes.  The patient's mother died at the age of 50 from cancer of the pancreas. The patient's sister, Cheryl Mendoza, lives in [place].  The patient has a brother, Cheryl Mendoza, also in the [place], and two half brothers, Cheryl Mendoza and Cheryl Mendoza, also in the [place].    GYNECOLOGIC HISTORY: The patient is G7, P6.  Her six surviving children live in [place]. The youngest is over 5 years old..    SOCIAL HISTORY: The patient lives with her sister, Cheryl Mendoza, and  Cheryl Mendoza's husband. 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 They tell me they have a niece at [employer] studying medicine right now.  The patient is a member of the [employer].   ADVANCED DIRECTIVES:  HEALTH MAINTENANCE: History  Substance Use Topics  . Smoking status: Never Smoker   . Smokeless tobacco: Never Used  . Alcohol Use: No     Colonoscopy:  PAP:  Bone density:  Lipid panel:  No Known Allergies  Current Outpatient Prescriptions  Medication Sig Dispense Refill  . fulvestrant (FASLODEX) 250 MG/5ML injection Inject 250 mg into the muscle every 30 (thirty) days. One injection each buttock over 1-2 minutes. Warm prior to use.      [name] aspirin 81 MG tablet Take 81 mg by mouth daily.      . ciprofloxacin (CIPRO) 500 MG tablet Take 1 tablet (500 mg total) by mouth 2 (two) times daily.  14 tablet  0  . triamterene-hydrochlorothiazide (DYAZIDE) 37.5-25 MG per capsule Take 1 each (1 capsule total) by mouth every morning.  90 capsule  12    OBJECTIVE: Middle-aged African woman who appears comfortable and in no acute distress Filed Vitals:   09/02/12 0858  BP: 134/88  Pulse: 70  Temp: 98.7 F (37.1 C)  Resp: 20     Body mass index is 32.86 kg/(m^2).    ECOG FS: 0 Filed Weights   09/02/12 0858  Weight: 185 lb 8 oz (84.142 kg)   Sclerae unicteric Oropharynx clear No cervical or supraclavicular adenopathy Lungs clear to auscultation on the right; diminished breath sounds in the base of the left lung, with dullness to percussion on the left Heart regular rate and rhythm Abd soft, nontender, bowel sounds positive. MSK no focal spinal tenderness, no peripheral edema Neuro: nonfocal, alert and oriented x3 Breasts: Status post bilateral mastectomies. No evidence of chest wall recurrence. Both axillae are clear to palpation  LAB RESULTS: Lab Results  Component Value Date   WBC 2.5* 09/02/2012   NEUTROABS 0.7*  09/02/2012   HGB 14.0 09/02/2012   HCT 40.5 09/02/2012   MCV 90.3 09/02/2012   PLT 200 09/02/2012      Chemistry      Component Value Date/Time   NA 141 08/12/2012 0822   NA 143 05/20/2012 1239   K 3.8 08/12/2012 0822   K 3.7 05/20/2012 1239   CL 104 08/12/2012 0822   CL 101 05/20/2012 1239   CO2 24 08/12/2012 0822   CO2 32 05/20/2012 1239   BUN 16.0 08/12/2012 0822   BUN 13 05/20/2012 1239   CREATININE 1.0 08/12/2012 0822   CREATININE 0.99 05/20/2012 1239      Component Value Date/Time   CALCIUM 9.8 08/12/2012 0822   CALCIUM 9.4 05/20/2012 1239   ALKPHOS 70 08/12/2012 0822   ALKPHOS 67 05/20/2012 1239   AST 15 08/12/2012 0822   AST 18 05/20/2012 1239   ALT 13 08/12/2012 0822   ALT 14  05/20/2012 1239   BILITOT 0.50 08/12/2012 0822   BILITOT 0.7 05/20/2012 1239       Lab Results  Component Value Date   LABCA2 45* 05/20/2012     STUDIES:  Ct Chest W Contrast  08/28/2012  *RADIOLOGY REPORT*  Clinical Data:  Bilateral breast cancer.  Chemotherapy and radiation therapy complete.  CT CHEST, ABDOMEN AND PELVIS WITH CONTRAST  Technique:  Multidetector CT imaging of the chest, abdomen and pelvis was performed following the standard protocol during bolus administration of intravenous contrast.  Contrast: OMNIPAQUE IOHEXOL 300 MG/ML  SOLN  Comparison:  PET 07/06/2011, CT chest 06/19/2011 and CT abdomen pelvis 06/27/2006.  CT CHEST  Findings:  No pathologically enlarged mediastinal, hilar or axillary lymph nodes.  Heart size normal.  No pericardial effusion.  Large left pleural effusion, minimally loculated anterolaterally. There are associated areas of pleural thickening and nodularity. Mild subpleural radiation fibrosis in the left upper lobe.  Volume loss in the lingula and left lower lobe.  Irregular subpleural nodular density in the left upper lobe measures roughly 12 x 7 mm (image 12), stable from 06/19/2011.  A 4 mm nodule in the posterior segment right upper lobe (image 22) is unchanged.   Similarly, a smudgy nodule in the subpleural right lower lobe, measuring approximately 6 mm, is likely unchanged as well.  No right pleural fluid.  Airway is otherwise unremarkable.  IMPRESSION:  1.  Moderate left pleural effusion with associated pleural thickening and nodularity, highly worrisome for metastatic disease. 2.  Small right lung nodules appear unchanged.  Continued attention on follow-up exams is warranted.  CT ABDOMEN AND PELVIS  Findings:  Low attenuation lesions in the liver measure up to 1.4 cm in the dome, as before.  Liver, gallbladder and adrenal glands are otherwise unremarkable.  Low attenuation lesions in the kidneys measure up to 9 mm on the right, as before.  Spleen, pancreas, stomach and bowel are unremarkable.  Uterus and ovaries are visualized.  No free fluid.  No pathologically enlarged lymph nodes. Periumbilical hernia contains fat.  No worrisome lytic or sclerotic lesions.  IMPRESSION: No evidence of metastatic disease in the abdomen or pelvis.   Original Report Authenticated By: Reyes Ivan, M.D.    Ct Abdomen Pelvis W Contrast  08/28/2012  *RADIOLOGY REPORT*  Clinical Data:  Bilateral breast cancer.  Chemotherapy and radiation therapy complete.  CT CHEST, ABDOMEN AND PELVIS WITH CONTRAST  Technique:  Multidetector CT imaging of the chest, abdomen and pelvis was performed following the standard protocol during bolus administration of intravenous contrast.  Contrast: OMNIPAQUE IOHEXOL 300 MG/ML  SOLN  Comparison:  PET 07/06/2011, CT chest 06/19/2011 and CT abdomen pelvis 06/27/2006.  CT CHEST  Findings:  No pathologically enlarged mediastinal, hilar or axillary lymph nodes.  Heart size normal.  No pericardial effusion.  Large left pleural effusion, minimally loculated anterolaterally. There are associated areas of pleural thickening and nodularity. Mild subpleural radiation fibrosis in the left upper lobe.  Volume loss in the lingula and left lower lobe.  Irregular  subpleural nodular density in the left upper lobe measures roughly 12 x 7 mm (image 12), stable from 06/19/2011.  A 4 mm nodule in the posterior segment right upper lobe (image 22) is unchanged.  Similarly, a smudgy nodule in the subpleural right lower lobe, measuring approximately 6 mm, is likely unchanged as well.  No right pleural fluid.  Airway is otherwise unremarkable.  IMPRESSION:  1.  Moderate left pleural effusion with  associated pleural thickening and nodularity, highly worrisome for metastatic disease. 2.  Small right lung nodules appear unchanged.  Continued attention on follow-up exams is warranted.  CT ABDOMEN AND PELVIS  Findings:  Low attenuation lesions in the liver measure up to 1.4 cm in the dome, as before.  Liver, gallbladder and adrenal glands are otherwise unremarkable.  Low attenuation lesions in the kidneys measure up to 9 mm on the right, as before.  Spleen, pancreas, stomach and bowel are unremarkable.  Uterus and ovaries are visualized.  No free fluid.  No pathologically enlarged lymph nodes. Periumbilical hernia contains fat.  No worrisome lytic or sclerotic lesions.  IMPRESSION: No evidence of metastatic disease in the abdomen or pelvis.   Original Report Authenticated By: Reyes Ivan, M.D.    US Thoracentesis Asp Pleural Space W/img Guide  08/13/2012  *RADIOLOGY REPORT*  Clinical Data:  History of breast cancer with enlarging left pleural effusion.  Request has been made for therapeutic and diagnostic left-sided thoracentesis.  ULTRASOUND GUIDED left THORACENTESIS  Comparison:  Chest x-ray examination earlier today.  An ultrasound guided thoracentesis was thoroughly discussed with the patient and questions answered.  The benefits, risks, alternatives and complications were also discussed.  The patient understands and wishes to proceed with the procedure.  Written consent was obtained.  Ultrasound was performed to localize and mark an adequate pocket of fluid in the left chest.   The area was then prepped and draped in the normal sterile fashion.  1% Lidocaine was used for local anesthesia.  Under ultrasound guidance a 19 gauge Yueh catheter was introduced.  Thoracentesis was performed.  The catheter was removed and a dressing applied.  Complications:  None immediate  Findings: A total of approximately 1.2 liters of amber-colored fluid was removed. A fluid sample was sent for laboratory analysis.  IMPRESSION: Successful ultrasound guided left thoracentesis yielding 1.2 liters of pleural fluid.  Read by: Anselm Pancoast, P.A.-C   Original Report Authenticated By: Sterling Big, M.D.      ASSESSMENT: 61 y.o.  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  Treated neoadjuvantly with doxorubicin and cyclophosphamide x4 given in dose-dense fashion and followed by paclitaxel x6.  Received post mastectomy radiation and received trastuzumab from August 2007 until April 2008.  Tumor was HER-2/neu equivocal.  Also started on tamoxifen after the completion of radiation therapy.  (2) Right breast carcinoma initially biopsied in June 2007, status post right modified radical mastectomy in July 2007 for a 7-mm invasive ductal carcinoma, grade 1, with 15 of 27 lymph nodes involved.  Tumor was ER positive, weakly PR positive, HER-2/neu equivocal with low proliferation fraction.  Status post radiation including pelvic radiation to induce ovarian castration, then started on exemestane in October 2007  (3) malignant pleural effusion cytologically documented October 2013, the cells still being estrogen receptor positive.  (4) fulvestrant started 08/19/2012  PLAN:  Teala will receive her second "loading dose" of Faslodex today, and again in 2 weeks.  She will then go to an every 4 week schedule.  We are working on finding out whether or not she will have these  drugs available to her in [place], and if not, perhaps we can help her get them through this office until she is able to come back in to the [place] to get further treatment.  I am starting Cheryl Mendoza on Cipro  prophylactically for neutropenia, especially in light of her slight cough. She knows to call with any fevers 100 or above.   [employer]    09/02/2012

## 2012-09-16 ENCOUNTER — Ambulatory Visit (HOSPITAL_BASED_OUTPATIENT_CLINIC_OR_DEPARTMENT_OTHER): Payer: Self-pay

## 2012-09-16 ENCOUNTER — Other Ambulatory Visit: Payer: Self-pay | Admitting: Physician Assistant

## 2012-09-16 ENCOUNTER — Other Ambulatory Visit (HOSPITAL_BASED_OUTPATIENT_CLINIC_OR_DEPARTMENT_OTHER): Payer: Self-pay | Admitting: Lab

## 2012-09-16 VITALS — BP 142/99 | HR 71 | Temp 97.1°F

## 2012-09-16 DIAGNOSIS — C50919 Malignant neoplasm of unspecified site of unspecified female breast: Secondary | ICD-10-CM

## 2012-09-16 DIAGNOSIS — C50019 Malignant neoplasm of nipple and areola, unspecified female breast: Secondary | ICD-10-CM

## 2012-09-16 DIAGNOSIS — C50219 Malignant neoplasm of upper-inner quadrant of unspecified female breast: Secondary | ICD-10-CM

## 2012-09-16 DIAGNOSIS — Z5111 Encounter for antineoplastic chemotherapy: Secondary | ICD-10-CM

## 2012-09-16 DIAGNOSIS — C773 Secondary and unspecified malignant neoplasm of axilla and upper limb lymph nodes: Secondary | ICD-10-CM

## 2012-09-16 LAB — COMPREHENSIVE METABOLIC PANEL (CC13)
Albumin: 3.5 g/dL (ref 3.5–5.0)
Alkaline Phosphatase: 64 U/L (ref 40–150)
BUN: 16 mg/dL (ref 7.0–26.0)
Calcium: 9.4 mg/dL (ref 8.4–10.4)
Chloride: 105 mEq/L (ref 98–107)
Glucose: 84 mg/dl (ref 70–99)
Potassium: 3.5 mEq/L (ref 3.5–5.1)

## 2012-09-16 LAB — CBC WITH DIFFERENTIAL/PLATELET
Basophils Absolute: 0 10*3/uL (ref 0.0–0.1)
EOS%: 3.2 % (ref 0.0–7.0)
HCT: 38.8 % (ref 34.8–46.6)
HGB: 13.2 g/dL (ref 11.6–15.9)
MCH: 30.4 pg (ref 25.1–34.0)
NEUT%: 40 % (ref 38.4–76.8)
Platelets: 213 10*3/uL (ref 145–400)
lymph#: 1.5 10*3/uL (ref 0.9–3.3)

## 2012-09-16 MED ORDER — FULVESTRANT 250 MG/5ML IM SOLN
500.0000 mg | Freq: Once | INTRAMUSCULAR | Status: AC
  Administered 2012-09-16: 500 mg via INTRAMUSCULAR
  Filled 2012-09-16: qty 10

## 2012-09-18 ENCOUNTER — Other Ambulatory Visit: Payer: Self-pay | Admitting: Lab

## 2012-09-18 ENCOUNTER — Ambulatory Visit: Payer: Self-pay | Admitting: Physician Assistant

## 2012-10-14 ENCOUNTER — Ambulatory Visit (HOSPITAL_BASED_OUTPATIENT_CLINIC_OR_DEPARTMENT_OTHER): Payer: Self-pay | Admitting: Oncology

## 2012-10-14 ENCOUNTER — Encounter: Payer: Self-pay | Admitting: Oncology

## 2012-10-14 ENCOUNTER — Other Ambulatory Visit (HOSPITAL_BASED_OUTPATIENT_CLINIC_OR_DEPARTMENT_OTHER): Payer: Self-pay | Admitting: Lab

## 2012-10-14 ENCOUNTER — Telehealth: Payer: Self-pay | Admitting: Oncology

## 2012-10-14 VITALS — BP 134/89 | HR 77 | Temp 98.1°F | Resp 20 | Ht 63.0 in | Wt 186.1 lb

## 2012-10-14 DIAGNOSIS — C50919 Malignant neoplasm of unspecified site of unspecified female breast: Secondary | ICD-10-CM

## 2012-10-14 DIAGNOSIS — J910 Malignant pleural effusion: Secondary | ICD-10-CM

## 2012-10-14 DIAGNOSIS — C50019 Malignant neoplasm of nipple and areola, unspecified female breast: Secondary | ICD-10-CM

## 2012-10-14 DIAGNOSIS — C50419 Malignant neoplasm of upper-outer quadrant of unspecified female breast: Secondary | ICD-10-CM

## 2012-10-14 DIAGNOSIS — Z5111 Encounter for antineoplastic chemotherapy: Secondary | ICD-10-CM

## 2012-10-14 LAB — CBC WITH DIFFERENTIAL/PLATELET
BASO%: 0.3 % (ref 0.0–2.0)
EOS%: 1.3 % (ref 0.0–7.0)
HCT: 38.4 % (ref 34.8–46.6)
LYMPH%: 42.5 % (ref 14.0–49.7)
MCH: 31.4 pg (ref 25.1–34.0)
MCHC: 34.5 g/dL (ref 31.5–36.0)
MONO%: 10.5 % (ref 0.0–14.0)
NEUT%: 45.4 % (ref 38.4–76.8)
Platelets: 208 10*3/uL (ref 145–400)

## 2012-10-14 MED ORDER — FULVESTRANT 250 MG/5ML IM SOLN
500.0000 mg | Freq: Once | INTRAMUSCULAR | Status: AC
  Administered 2012-10-14: 500 mg via INTRAMUSCULAR
  Filled 2012-10-14: qty 10

## 2012-10-14 MED ORDER — FULVESTRANT 250 MG/5ML IM SOLN: 500.0000 mg | INTRAMUSCULAR | Status: AC

## 2012-10-14 NOTE — Progress Notes (Signed)
ID: Cheryl Mendoza   DOB: 1951/07/09  MR#: [id number]  WUJ#:[id number]  HISTORY OF PRESENT ILLNESS: The patient tells me she first noted a problem in her left breast in February of 2004 when she was in [place].  She went to the hospital there, and they put her on some pills.  However, the pills did not help.    She came to the [place] in November of 05 and on March 8th, she was scheduled for screening mammography by [facility]. This showed a possible subareolar mass with nipple retraction in the left breast.  On the 16th, additional views were obtained as well as a fuller physical exam. On exam, there was a large, firm mass in the subareolar region and the inner half of the left breast with marked skin thickening peau d'orange and hyperpigmentation.  The breast was very firm and spot compression mammographic views could not be obtained.  Sonography, however, showed diffuse hypoechoic mass throughout the subareolar region in the left lower inner quadrant.  In the left axilla, there was a spiculated mass measuring up to 1.1 cm and also several abnormal lymph nodes, with decrease in the normal fatty hilus and a thickened cortex.  On the same day, the patient had ultrasound-guided biopsies of both the breast mass and the left axillary mass, and this showed ([id number]) a low to intermediate grade breast cancer with no definitive evidence of lymphovascular invasion, but with a positive lymph node biopsy. The tumor cells were ER positive at 18%, PR negative and HER-2/neu negative by FISH.    The patient's subsequent history is summarized below  INTERVAL HISTORY: Cheryl Mendoza returns today with her sister for followup of her bilateral breast carcinoma. She was started on fulvestrant in October, receiving her third treatment November 11, and today will receive her first 28 daily treatments.    REVIEW OF SYSTEMS: She is tolerating the injections without unusual side effects. There has been no pain, no  bleeding, and no rash. She has a little bit of a dry cough, but no purulence, no shortness of breath, no pleurisy, and no hemoptysis. There have been no unusual headaches, visual changes, or change in bowel or bladder habits. Sometimes she has pain in the left shoulder posteriorly. This is intermittent. It is not intense. A detailed review of systems was otherwise  PAST MEDICAL HISTORY: Past Medical History  Diagnosis Date  . Hypertension   . Cancer     BILATERAL MASTECTOMIES FOR BREAST CANCER / FINISHED CHEMO AND RADIATION --DR. MAGRINOT IS  PT'S ONCOLOGIST.  status post removal of a filarial nodule from the left lower back remotely.    PAST SURGICAL HISTORY: Past Surgical History  Procedure Date  . Port a cath insert   . Breast surgery     2006 LEFT MASTECTOMY  AND 2007 RIGHT MASTECTOMY - FOR TX OF BREAST CANCER-FINISHED CHEMO AND RADIATION--STATES CANCER FREE NOW.  . Tubal ligation     PT STATES SURGERY SO SHE WOULD NOT HAVE ANY MORE CHILDREN  . Port-a-cath removal 03/20/2012    Procedure: REMOVAL PORT-A-CATH;  Surgeon: Almond Lint, MD;  Location: [facility];  Service: General;  Laterality: N/A;    FAMILY HISTORY The patient's father died at the age of 35 with hypertension and diabetes.  The patient's mother died at the age of 36 from cancer of the pancreas. The patient's sister, Cheryl Mendoza, lives in [place].  The patient has a brother, Cheryl Mendoza, also in the [place], and two half brothers, Cheryl Mendoza  and Cheryl Mendoza, also in the [place].    GYNECOLOGIC HISTORY: The patient is G7, P6.  Her six surviving children live in [place]. The youngest is over 4 years old..    SOCIAL HISTORY: The patient lives with her sister, Cheryl Mendoza, and Cheryl Mendoza's husband. 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 They tell me they have a niece at [employer] studying medicine right now.  The patient is a member of the [employer].   ADVANCED  DIRECTIVES:  HEALTH MAINTENANCE: History  Substance Use Topics  . Smoking status: Never Smoker   . Smokeless tobacco: Never Used  . Alcohol Use: No     Colonoscopy:  PAP:  Bone density:  Lipid panel:  No Known Allergies  Current Outpatient Prescriptions  Medication Sig Dispense Refill  . fulvestrant (FASLODEX) 250 MG/5ML injection Inject 10 mLs (500 mg total) into the muscle every 30 (thirty) days. One injection each buttock over 1-2 minutes. Warm prior to use.  12 Syringe  6  . triamterene-hydrochlorothiazide (DYAZIDE) 37.5-25 MG per capsule Take 1 each (1 capsule total) by mouth every morning.  90 capsule  12    OBJECTIVE: Middle-aged African woman in no acute distress Filed Vitals:   10/14/12 1026  BP: 134/89  Pulse: 77  Temp: 98.1 F (36.7 C)  Resp: 20     Body mass index is 32.97 kg/(m^2).    ECOG FS: 1 Filed Weights   10/14/12 1026  Weight: 186 lb 1.6 oz (84.414 kg)   Sclerae unicteric Oropharynx clear No cervical or supraclavicular adenopathy Lungs clear to auscultation on the right; diminished breath sounds in the base of the left lung, with dullness to percussion on the left Heart regular rate and rhythm Abd soft, nontender, bowel sounds positive. MSK no focal spinal tenderness, no peripheral edema; careful examination of the entire left back and scapula shows no focal tenderness, no swelling or erythema  Neuro: nonfocal, alert and oriented x3 Breasts: Status post bilateral mastectomies. No evidence of chest wall recurrence. Both axillae are clear to palpation  LAB RESULTS: Lab Results  Component Value Date   WBC 4.1 10/14/2012   NEUTROABS 1.9 10/14/2012   HGB 13.3 10/14/2012   HCT 38.4 10/14/2012   MCV 90.9 10/14/2012   PLT 208 10/14/2012      Chemistry      Component Value Date/Time   NA 141 09/16/2012 0819   NA 143 05/20/2012 1239   K 3.5 09/16/2012 0819   K 3.7 05/20/2012 1239   CL 105 09/16/2012 0819   CL 101 05/20/2012 1239   CO2 30* 09/16/2012  0819   CO2 32 05/20/2012 1239   BUN 16.0 09/16/2012 0819   BUN 13 05/20/2012 1239   CREATININE 1.0 09/16/2012 0819   CREATININE 0.99 05/20/2012 1239      Component Value Date/Time   CALCIUM 9.4 09/16/2012 0819   CALCIUM 9.4 05/20/2012 1239   ALKPHOS 64 09/16/2012 0819   ALKPHOS 67 05/20/2012 1239   AST 19 09/16/2012 0819   AST 18 05/20/2012 1239   ALT 19 09/16/2012 0819   ALT 14 05/20/2012 1239   BILITOT 0.47 09/16/2012 0819   BILITOT 0.7 05/20/2012 1239       Lab Results  Component Value Date   LABCA2 41* 09/16/2012     STUDIES:  Ct Chest W Contrast  08/28/2012  *RADIOLOGY REPORT*  Clinical Data:  Bilateral breast cancer.  Chemotherapy and radiation therapy complete.  CT CHEST, ABDOMEN AND PELVIS WITH CONTRAST  Technique:  Multidetector CT imaging of the chest, abdomen and pelvis was performed following the standard protocol during bolus administration of intravenous contrast.  Contrast: OMNIPAQUE IOHEXOL 300 MG/ML  SOLN  Comparison:  PET 07/06/2011, CT chest 06/19/2011 and CT abdomen pelvis 06/27/2006.  CT CHEST  Findings:  No pathologically enlarged mediastinal, hilar or axillary lymph nodes.  Heart size normal.  No pericardial effusion.  Large left pleural effusion, minimally loculated anterolaterally. There are associated areas of pleural thickening and nodularity. Mild subpleural radiation fibrosis in the left upper lobe.  Volume loss in the lingula and left lower lobe.  Irregular subpleural nodular density in the left upper lobe measures roughly 12 x 7 mm (image 12), stable from 06/19/2011.  A 4 mm nodule in the posterior segment right upper lobe (image 22) is unchanged.  Similarly, a smudgy nodule in the subpleural right lower lobe, measuring approximately 6 mm, is likely unchanged as well.  No right pleural fluid.  Airway is otherwise unremarkable.  IMPRESSION:  1.  Moderate left pleural effusion with associated pleural thickening and nodularity, highly worrisome for metastatic  disease. 2.  Small right lung nodules appear unchanged.  Continued attention on follow-up exams is warranted.  CT ABDOMEN AND PELVIS  Findings:  Low attenuation lesions in the liver measure up to 1.4 cm in the dome, as before.  Liver, gallbladder and adrenal glands are otherwise unremarkable.  Low attenuation lesions in the kidneys measure up to 9 mm on the right, as before.  Spleen, pancreas, stomach and bowel are unremarkable.  Uterus and ovaries are visualized.  No free fluid.  No pathologically enlarged lymph nodes. Periumbilical hernia contains fat.  No worrisome lytic or sclerotic lesions.  IMPRESSION: No evidence of metastatic disease in the abdomen or pelvis.   Original Report Authenticated By: Reyes Ivan, M.D.    Ct Abdomen Pelvis W Contrast  08/28/2012  *RADIOLOGY REPORT*  Clinical Data:  Bilateral breast cancer.  Chemotherapy and radiation therapy complete.  CT CHEST, ABDOMEN AND PELVIS WITH CONTRAST  Technique:  Multidetector CT imaging of the chest, abdomen and pelvis was performed following the standard protocol during bolus administration of intravenous contrast.  Contrast: OMNIPAQUE IOHEXOL 300 MG/ML  SOLN  Comparison:  PET 07/06/2011, CT chest 06/19/2011 and CT abdomen pelvis 06/27/2006.  CT CHEST  Findings:  No pathologically enlarged mediastinal, hilar or axillary lymph nodes.  Heart size normal.  No pericardial effusion.  Large left pleural effusion, minimally loculated anterolaterally. There are associated areas of pleural thickening and nodularity. Mild subpleural radiation fibrosis in the left upper lobe.  Volume loss in the lingula and left lower lobe.  Irregular subpleural nodular density in the left upper lobe measures roughly 12 x 7 mm (image 12), stable from 06/19/2011.  A 4 mm nodule in the posterior segment right upper lobe (image 22) is unchanged.  Similarly, a smudgy nodule in the subpleural right lower lobe, measuring approximately 6 mm, is likely unchanged as well.   No right pleural fluid.  Airway is otherwise unremarkable.  IMPRESSION:  1.  Moderate left pleural effusion with associated pleural thickening and nodularity, highly worrisome for metastatic disease. 2.  Small right lung nodules appear unchanged.  Continued attention on follow-up exams is warranted.  CT ABDOMEN AND PELVIS  Findings:  Low attenuation lesions in the liver measure up to 1.4 cm in the dome, as before.  Liver, gallbladder and adrenal glands are otherwise unremarkable.  Low attenuation lesions in the kidneys measure up to 9 mm on the right, as before.  Spleen, pancreas, stomach and bowel are unremarkable.  Uterus and ovaries are visualized.  No free fluid.  No pathologically enlarged lymph nodes. Periumbilical hernia contains fat.  No worrisome lytic or sclerotic lesions.  IMPRESSION: No evidence of metastatic disease in the abdomen or pelvis.   Original Report Authenticated By: Reyes Ivan, M.D.    US Thoracentesis Asp Pleural Space W/img Guide  08/13/2012  *RADIOLOGY REPORT*  Clinical Data:  History of breast cancer with enlarging left pleural effusion.  Request has been made for therapeutic and diagnostic left-sided thoracentesis.  ULTRASOUND GUIDED left THORACENTESIS  Comparison:  Chest x-ray examination earlier today.  An ultrasound guided thoracentesis was thoroughly discussed with the patient and questions answered.  The benefits, risks, alternatives and complications were also discussed.  The patient understands and wishes to proceed with the procedure.  Written consent was obtained.  Ultrasound was performed to localize and mark an adequate pocket of fluid in the left chest.  The area was then prepped and draped in the normal sterile fashion.  1% Lidocaine was used for local anesthesia.  Under ultrasound guidance a 19 gauge Yueh catheter was introduced.  Thoracentesis was performed.  The catheter was removed and a dressing applied.  Complications:  None immediate  Findings: A total of  approximately 1.2 liters of amber-colored fluid was removed. A fluid sample was sent for laboratory analysis.  IMPRESSION: Successful ultrasound guided left thoracentesis yielding 1.2 liters of pleural fluid.  Read by: Anselm Pancoast, P.A.-C   Original Report Authenticated By: Sterling Big, M.D.      ASSESSMENT: 61 y.o.  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  Treated neoadjuvantly with doxorubicin and cyclophosphamide x4 given in dose-dense fashion and followed by paclitaxel x6.  Received post mastectomy radiation and received trastuzumab from August 2007 until April 2008.  Tumor was HER-2/neu equivocal.  Also started on tamoxifen after the completion of radiation therapy.  (2) Right breast carcinoma initially biopsied in June 2007, status post right modified radical mastectomy in July 2007 for a 7-mm invasive ductal carcinoma, grade 1, with 15 of 27 lymph nodes involved.  Tumor was ER positive, weakly PR positive, HER-2/neu equivocal with low proliferation fraction.  Status post radiation including pelvic radiation to induce ovarian castration, then started on exemestane in October 2007  (3) malignant pleural effusion cytologically documented October 2013, the cells still being estrogen receptor positive.  (4) fulvestrant started 08/19/2012  PLAN:  Keishla received her first monthly dose of fulvestrant today. Her next dose will be due January 6. She will be in [place] then. We have obtained three-month supply of Faslodex for her for courtesy of the company. Unfortunately a that will not be here before she leaves, so we will give it to her nephew, will be going to [place] within 2 weeks. I have made her a return appointment here for a month from now, but unless her flight is canceled first reason she is likely not to show.  At this point since her  sister's going to [place] with her, we would need to contact her sisters' husband, Marchia Bond, [phone], for updates and other communication.   Sang Blount C    10/14/2012

## 2012-10-14 NOTE — Telephone Encounter (Signed)
lmonvm for pt re next 2 appts for 11/11/12 and 12/09/12. Schedule for January 2014 thru May 2014 mailed today.

## 2012-10-15 ENCOUNTER — Ambulatory Visit: Payer: Self-pay | Admitting: Oncology

## 2012-10-29 ENCOUNTER — Other Ambulatory Visit: Payer: Self-pay | Admitting: *Deleted

## 2012-11-11 ENCOUNTER — Other Ambulatory Visit: Payer: Self-pay | Admitting: Lab

## 2012-11-11 ENCOUNTER — Ambulatory Visit: Payer: Self-pay

## 2012-12-09 ENCOUNTER — Ambulatory Visit: Payer: Self-pay | Admitting: Oncology

## 2012-12-09 ENCOUNTER — Ambulatory Visit: Payer: Self-pay

## 2012-12-09 ENCOUNTER — Other Ambulatory Visit: Payer: Self-pay | Admitting: Lab

## 2012-12-09 NOTE — Progress Notes (Addendum)
Called [employer] to get the status of the application Ebony sent in 08/28/12.  They told me they needed a letter of support from [employer]. [employer] wrote the letter and I faxed it to [phone] Phone [phone].  We gave the patient 3 syringes.  10/21/12 Fax from [facility] that was completed and faxed back.  12/09/12 We will give [employer] 3 more syringes.

## 2013-01-03 ENCOUNTER — Other Ambulatory Visit: Payer: Self-pay | Admitting: Oncology

## 2013-01-06 ENCOUNTER — Ambulatory Visit: Payer: Self-pay

## 2013-01-06 ENCOUNTER — Other Ambulatory Visit: Payer: Self-pay | Admitting: Lab

## 2013-02-03 ENCOUNTER — Other Ambulatory Visit: Payer: Self-pay | Admitting: Lab

## 2013-02-03 ENCOUNTER — Telehealth: Payer: Self-pay | Admitting: *Deleted

## 2013-02-03 ENCOUNTER — Ambulatory Visit: Payer: Self-pay

## 2013-02-03 NOTE — Telephone Encounter (Signed)
Called patient's brother-in-law to see if patient is still out of the country.  Zettie Pho states that she is still in [place] and will not be returning any time soon.

## 2013-02-12 ENCOUNTER — Other Ambulatory Visit: Payer: Self-pay | Admitting: *Deleted

## 2013-02-12 DIAGNOSIS — C50912 Malignant neoplasm of unspecified site of left female breast: Secondary | ICD-10-CM

## 2013-02-12 MED ORDER — ANASTROZOLE 1 MG PO TABS: 1.0000 mg | ORAL_TABLET | Freq: Every day | ORAL | Status: DC

## 2013-02-12 NOTE — Telephone Encounter (Signed)
Call from [place], PhD with [facility] pharmacy. Patient family member(sister) is in to get new Rx for Anastrozole. Patient is home in [place], [place], but has family going over next week to carry patient her meds.  Per Dr Darnelle Catalan, patient was unable to get needed Faslodex injections across to [place], so pills will be her only option from the states. Patient is to make appt when she returns for follow up. Information was shared with sister via the pharmacist.

## 2013-02-13 ENCOUNTER — Telehealth: Payer: Self-pay | Admitting: *Deleted

## 2013-02-13 ENCOUNTER — Other Ambulatory Visit: Payer: Self-pay | Admitting: *Deleted

## 2013-02-13 NOTE — Telephone Encounter (Signed)
Per pof. Due to the pt is out of the country. Pt will call the office upon her return.

## 2013-02-14 ENCOUNTER — Encounter (INDEPENDENT_AMBULATORY_CARE_PROVIDER_SITE_OTHER): Payer: Self-pay | Admitting: General Surgery

## 2013-03-03 ENCOUNTER — Other Ambulatory Visit: Payer: Self-pay | Admitting: Lab

## 2013-03-03 ENCOUNTER — Ambulatory Visit: Payer: Self-pay

## 2013-03-31 ENCOUNTER — Ambulatory Visit: Payer: Self-pay

## 2013-03-31 ENCOUNTER — Other Ambulatory Visit: Payer: Self-pay | Admitting: Lab

## 2013-08-04 ENCOUNTER — Encounter (INDEPENDENT_AMBULATORY_CARE_PROVIDER_SITE_OTHER): Payer: Self-pay

## 2014-08-18 ENCOUNTER — Other Ambulatory Visit: Payer: Self-pay | Admitting: *Deleted

## 2014-08-18 DIAGNOSIS — C50912 Malignant neoplasm of unspecified site of left female breast: Secondary | ICD-10-CM

## 2014-08-18 MED ORDER — ANASTROZOLE 1 MG PO TABS: 1.0000 mg | ORAL_TABLET | Freq: Every day | ORAL | Status: DC

## 2014-08-18 NOTE — Telephone Encounter (Signed)
[employer] called requesting a refill on medication that they can obtain to mail to pt in [place].

## 2014-09-02 ENCOUNTER — Other Ambulatory Visit: Payer: Self-pay | Admitting: *Deleted

## 2014-09-02 DIAGNOSIS — C50912 Malignant neoplasm of unspecified site of left female breast: Secondary | ICD-10-CM

## 2014-09-02 MED ORDER — ANASTROZOLE 1 MG PO TABS: 1.0000 mg | ORAL_TABLET | Freq: Every day | ORAL | Status: AC

## 2015-03-31 ENCOUNTER — Telehealth: Payer: Self-pay | Admitting: *Deleted

## 2015-03-31 NOTE — Telephone Encounter (Signed)
Sister, [employer], called saying that patient is in a hospital in [place] with fluid in her lungs.  They are transferring her to a larger hospital tomorrow so they can take better care of her.  Sister is calling as she would like Dr. Jana Hakim to write a letter to bring the sister her on a plane.  Patient was last seen 10-14-12.  Will ask Marlon Pel RN to call sister back at [phone].

## 2015-04-01 ENCOUNTER — Encounter: Payer: Self-pay | Admitting: *Deleted

## 2015-04-02 ENCOUNTER — Encounter: Payer: Self-pay | Admitting: *Deleted

## 2015-04-02 NOTE — Telephone Encounter (Signed)
Letter obtained and placed in mail per contact with [employer], pt's sister.

## 2015-04-14 ENCOUNTER — Telehealth: Payer: Self-pay | Admitting: *Deleted

## 2015-04-14 NOTE — Telephone Encounter (Signed)
Family member states that she has received letter, but it is incorrect. Patient family member would like for you to resend letter stating the reason why she should travel to the [place], which would be for "reevaluating diagnosis." Message sent to RN.

## 2015-04-19 ENCOUNTER — Encounter: Payer: Self-pay | Admitting: *Deleted

## 2015-04-19 NOTE — Telephone Encounter (Signed)
New letter obtained and mailed 04/19/2015.

## 2015-07-16 ENCOUNTER — Telehealth: Payer: Self-pay

## 2015-07-16 NOTE — Telephone Encounter (Signed)
Called patient back and LVM for patient to return call.

## 2015-08-04 ENCOUNTER — Other Ambulatory Visit: Payer: Self-pay | Admitting: Oncology

## 2015-08-05 ENCOUNTER — Other Ambulatory Visit: Payer: Self-pay

## 2021-08-13 ENCOUNTER — Other Ambulatory Visit: Payer: Self-pay | Admitting: Nurse Practitioner
